# Patient Record
Sex: Female | Born: 1965 | Race: Black or African American | Hispanic: Refuse to answer | Marital: Single | Smoking: Never smoker
Health system: Southern US, Community
[De-identification: ages and names within clinical notes are randomized; demographics above are authoritative.]

## PROBLEM LIST (undated history)

## (undated) DIAGNOSIS — I10 Essential (primary) hypertension: Secondary | ICD-10-CM

## (undated) DIAGNOSIS — Z8709 Personal history of other diseases of the respiratory system: Secondary | ICD-10-CM

## (undated) HISTORY — PX: TONSILLECTOMY: SUR1361

## (undated) HISTORY — PX: TUBAL LIGATION: SHX77

## (undated) HISTORY — DX: Essential (primary) hypertension: I10

---

## 2012-01-07 HISTORY — PX: ABDOMINAL HYSTERECTOMY: SHX81

## 2017-09-30 ENCOUNTER — Ambulatory Visit: Payer: BC Managed Care – PPO | Attending: Family Medicine

## 2017-09-30 ENCOUNTER — Other Ambulatory Visit: Payer: Self-pay

## 2017-09-30 DIAGNOSIS — M542 Cervicalgia: Secondary | ICD-10-CM | POA: Insufficient documentation

## 2017-09-30 DIAGNOSIS — M546 Pain in thoracic spine: Secondary | ICD-10-CM | POA: Insufficient documentation

## 2017-09-30 DIAGNOSIS — R252 Cramp and spasm: Secondary | ICD-10-CM | POA: Diagnosis present

## 2017-09-30 NOTE — Patient Instructions (Signed)
Access Code: [email or web address]  URL: [email or web address]  Date: 09/30/2017  Prepared by: Lorrene Reid   Exercises  Seated Cervical Flexion AROM - 3 reps - 1 sets - 20 hold - 4x daily - 7x weekly  Seated Cervical Sidebending AROM - 3 reps - 1 sets - 20 hold - 4x daily - 7x weekly  Seated Cervical Rotation AROM - 3 reps - 1 sets - 20 hold - 4x daily - 7x weekly  Seated Correct Posture - 10 reps - 3 sets - 1x daily - 7x weekly  Cat-Camel to Child's Pose - 10 reps - 1 sets - 5 hold - 2x daily - 7x weekly  Child's Pose Stretch - 3 reps - 1 sets - 20 hold - 2x daily - 7x weekly  Child's Pose with Sidebending - 10 reps - 20 hold - 2x daily - 7x weekly

## 2017-09-30 NOTE — Therapy (Signed)
[facility] [address] [place], [place], [address] Phone: [phone]   Fax:  [phone]  Physical Therapy Evaluation  Patient Details  Name: Brooke Gonzalez MRN: [id number] Date of Birth: 09-Jun-1965 Referring Provider: Darrow Bussing, MD   Encounter Date: 09/30/2017  PT End of Session - 09/30/17 1144    Visit Number  1    Date for PT Re-Evaluation  11/25/17    Authorization Type  [employer] state health    PT Start Time  1101    PT Stop Time  1147    PT Time Calculation (min)  46 min    Activity Tolerance  Patient tolerated treatment well    Behavior During Therapy  [facility] for tasks assessed/performed       Past Medical History:  Diagnosis Date  . Hypertension     History reviewed. No pertinent surgical history.  There were no vitals filed for this visit.   Subjective Assessment - 09/30/17 1108    Subjective  Pt presents to PT with complaints of Rt sided neck and thoracic pain that began ~1 month ago.  Pt had an upper respiratory infection and has neck pain as result after recovery from this.  Pt reports that Rt neck feels tired and very tight.      Pertinent History  none    Limitations  Sitting    How long can you sit comfortably?  uncomfortable    Diagnostic tests  none    Patient Stated Goals  reduce neck pain and spasms, improve sleep    Currently in Pain?  Yes    Pain Score  3     Pain Location  Neck    Pain Orientation  Right    Pain Descriptors / Indicators  Aching;Shooting;Stabbing    Pain Type  Acute pain    Pain Onset  More than a month ago    Pain Frequency  Intermittent    Aggravating Factors   certain positions, turning head, sleep    Pain Relieving Factors  ice/heat, rubbing the muscles, change of position         [facility] PT Assessment - 09/30/17 0001      Assessment   Medical Diagnosis  strain of neck muscle, Rt sided neck and upper back pain    Referring Provider  Koirala, Dibas, MD     Onset Date/Surgical Date  08/30/17    Hand Dominance  Right    Next MD Visit  nothing scheduled- after PT    Prior Therapy  none      Precautions   Precautions  None      Restrictions   Weight Bearing Restrictions  No      Balance Screen   Has the patient fallen in the past 6 months  No    Has the patient had a decrease in activity level because of a fear of falling?   No    Is the patient reluctant to leave their home because of a fear of falling?   No      Home Public house manager residence      Prior Function   Level of Independence  Independent    Vocation  Full time employment    Careers information officer:  sitting, driving, desk -[employer]    Leisure  walking      Cognition   Overall Cognitive Status  Within Functional Limits for tasks assessed  Observation/Other Assessments   Focus on Therapeutic Outcomes (FOTO)   36% limitation      Posture/Postural Control   Posture/Postural Control  Postural limitations    Postural Limitations  Forward head      ROM / Strength   AROM / PROM / Strength  PROM;AROM;Strength      AROM   Overall AROM   Deficits    Overall AROM Comments  UE A/ROM is full without pain    AROM Assessment Site  Cervical    Cervical Flexion  --   full   Cervical Extension  --   full with painful A/ROM   Cervical - Right Side Bend  30    Cervical - Left Side Bend  25    Cervical - Right Rotation  65    Cervical - Left Rotation  65      PROM   Overall PROM   Within functional limits for tasks performed    Overall PROM Comments  full cervical P/ROM except sidebending limited by 25% with pain      Strength   Overall Strength  Within functional limits for tasks performed    Overall Strength Comments  5/5 UE strength      Palpation   Spinal mobility  reduced spinal segmental mobility C3-T9 with pain throughout with PA mobs    Palpation comment  Pt with significant tension over bil suboccipitals and Rt>Lt upper  traps.  Trigger points along Rt levator and rhomboids      Transfers   Transfers  Independent with all Transfers      Ambulation/Gait   Ambulation/Gait  Yes    Gait Pattern  Within Functional Limits                Objective measurements completed on examination: See above findings.              PT Education - 09/30/17 1143    Education Details   Access Code: [email or web address]     Person(s) Educated  Patient    Methods  Explanation;Demonstration;Handout    Comprehension  Returned demonstration;Verbalized understanding       PT Short Term Goals - 09/30/17 1059      PT SHORT TERM GOAL #1   Title  be independent in initial HEP    Time  4    Period  Weeks    Status  New    Target Date  10/28/17      PT SHORT TERM GOAL #2   Title  report < or = to 4/10 neck pain with turning head    Time  4    Period  Weeks    Target Date  10/28/17      PT SHORT TERM GOAL #3   Title  demonstrate Bil cervical sidebending to > or = to 35 degrees to improve mobility    Time  4    Period  Weeks    Status  New    Target Date  10/28/17        PT Long Term Goals - 09/30/17 1100      PT LONG TERM GOAL #1   Title  be independent in advanced HEP    Time  8    Period  Weeks    Status  New    Target Date  11/25/17      PT LONG TERM GOAL #2   Title  reduce FOTO to < or = to 29% limitation  Time  8    Period  Weeks    Target Date  11/25/17      PT LONG TERM GOAL #3   Title  demonstrate > or = to 75 degrees of cervical rotation bilaterally to improve safety with driving    Time  8    Period  Weeks    Status  New    Target Date  11/25/17      PT LONG TERM GOAL #4   Title  report < or = to 2/10 neck pain with work activity and self-care    Time  8    Period  Weeks    Status  New    Target Date  11/25/17             Plan - 09/30/17 1231    Clinical Impression Statement  Pt presents to PT with 1 month history of Rt sided neck and thoracic pain that began ~1  month ago after coughing a lot with upper respiratory infection.  Pt reports 3-6/10 neck pain with activity at home and at work as a Emergency planning/management officer at [employer].  Pt demonstrates reduced cervical A/ROM with pain with motion, reduced segmental mobility in the cervical and thoracic spine and trigger points in suboccipitals, cervical and thoracic paraspinals and Rt periscapular muscles.  Pt will benefit from skilled PT for manual therapy to address tension and trigger points, flexibility and cervical/thoracic strength to improve endurance for home and work tasks.      History and Personal Factors relevant to plan of care:  no medical history    Clinical Presentation  Stable    Clinical Presentation due to:  1 month history of neck pain after muscle strain-not worsening    Clinical Decision Making  Low    Rehab Potential  Excellent    PT Frequency  2x / week    PT Duration  8 weeks    PT Treatment/Interventions  ADLs/Self Care Home Management;Cryotherapy;Electrical Stimulation;Ultrasound;Moist Heat;Traction;Therapeutic activities;Therapeutic exercise;Patient/family education;Neuromuscular re-education;Manual techniques;Passive range of motion;Taping;Dry needling    PT Next Visit Plan  Dry needling to cervical and thoracic multifidi, suboccipitals and upper traps, review HEP, postural strength    PT Home Exercise Plan  Access Code: [email or web address]     Consulted and Agree with Plan of Care  Patient       Patient will benefit from skilled therapeutic intervention in order to improve the following deficits and impairments:  Impaired flexibility, Decreased range of motion, Decreased strength, Increased muscle spasms, Postural dysfunction, Decreased activity tolerance, Pain  Visit Diagnosis: Cervicalgia - Plan: PT plan of care cert/re-cert  Pain in thoracic spine - Plan: PT plan of care cert/re-cert  Cramp and spasm - Plan: PT plan of care cert/re-cert     Problem List There are no active problems to display  for this patient.    Lorrene Reid, PT 09/30/17 12:36 PM  [facility] [address] [place], [place], [address] Phone: [phone]   Fax:  [phone]  Name: Tylah Mancillas MRN: [id number] Date of Birth: 05-28-1965

## 2017-10-07 ENCOUNTER — Encounter: Payer: Self-pay | Admitting: Physical Therapy

## 2017-10-07 ENCOUNTER — Ambulatory Visit: Payer: BC Managed Care – PPO | Attending: Family Medicine | Admitting: Physical Therapy

## 2017-10-07 DIAGNOSIS — M546 Pain in thoracic spine: Secondary | ICD-10-CM | POA: Diagnosis present

## 2017-10-07 DIAGNOSIS — M542 Cervicalgia: Secondary | ICD-10-CM | POA: Diagnosis present

## 2017-10-07 DIAGNOSIS — R252 Cramp and spasm: Secondary | ICD-10-CM

## 2017-10-07 NOTE — Therapy (Signed)
[facility] [address] [place], [place], [address] Phone: [phone]   Fax:  [phone]  Physical Therapy Treatment  Patient Details  Name: Brooke Gonzalez MRN: [id number] Date of Birth: May 10, 1965 Referring Provider (PT): Darrow Bussing, MD   Encounter Date: 10/07/2017  PT End of Session - 10/07/17 0801    Visit Number  2    Date for PT Re-Evaluation  11/25/17    Authorization Type  [employer] state health    PT Start Time  0801    PT Stop Time  0850    PT Time Calculation (min)  49 min    Activity Tolerance  Patient tolerated treatment well    Behavior During Therapy  [facility] for tasks assessed/performed       Past Medical History:  Diagnosis Date  . Hypertension     History reviewed. No pertinent surgical history.  There were no vitals filed for this visit.  Subjective Assessment - 10/07/17 0838    Subjective  I am really hurting and I'm concerned I am getting worse. My exercises hurt.     Pertinent History  none    Patient Stated Goals  reduce neck pain and spasms, improve sleep    Currently in Pain?  Yes    Pain Score  5     Pain Location  Neck    Pain Orientation  Right;Left;Lateral    Pain Descriptors / Indicators  Sore    Aggravating Factors   tilting my head    Pain Relieving Factors  masage, heat    Multiple Pain Sites  No                       [facility] Adult PT Treatment/Exercise - 10/07/17 0001      Moist Heat Therapy   Number Minutes Moist Heat  15 Minutes    Moist Heat Location  Cervical      Electrical Stimulation   Electrical Stimulation Location  cervical    Electrical Stimulation Action  IFC    Electrical Stimulation Parameters  80-150 Hz    Electrical Stimulation Goals  Pain      Manual Therapy   Soft tissue mobilization  RT cervical > LT, multiple active trigger points noted in scalenes most             PT Education - 10/07/17 0841    Education Details   brief review of initial HEP: pt with correct demo but needed verbal encouragement to actually stretch at the frequency recommended, it is possible to be sore after or even during, but she is not injuring herself by stretching her neck muscles.     Person(s) Educated  Patient    Methods  Explanation;Demonstration;Verbal cues    Comprehension  Returned demonstration;Verbalized understanding       PT Short Term Goals - 09/30/17 1059      PT SHORT TERM GOAL #1   Title  be independent in initial HEP    Time  4    Period  Weeks    Status  New    Target Date  10/28/17      PT SHORT TERM GOAL #2   Title  report < or = to 4/10 neck pain with turning head    Time  4    Period  Weeks    Target Date  10/28/17      PT SHORT TERM GOAL #3   Title  demonstrate Bil  cervical sidebending to > or = to 35 degrees to improve mobility    Time  4    Period  Weeks    Status  New    Target Date  10/28/17        PT Long Term Goals - 09/30/17 1100      PT LONG TERM GOAL #1   Title  be independent in advanced HEP    Time  8    Period  Weeks    Status  New    Target Date  11/25/17      PT LONG TERM GOAL #2   Title  reduce FOTO to < or = to 29% limitation    Time  8    Period  Weeks    Target Date  11/25/17      PT LONG TERM GOAL #3   Title  demonstrate > or = to 75 degrees of cervical rotation bilaterally to improve safety with driving    Time  8    Period  Weeks    Status  New    Target Date  11/25/17      PT LONG TERM GOAL #4   Title  report < or = to 2/10 neck pain with work activity and self-care    Time  8    Period  Weeks    Status  New    Target Date  11/25/17            Plan - 10/07/17 0843    Clinical Impression Statement  Pt presents with more perceived pain ( she just finished her work shift and was exhausted.) She was quite concerned the stretches made her sore/hurt. Discussion about this noted in chart. Pt tight with active trigger points RT cervical > LT cervical  . This seemed to helped and pt reported feeling less pain at the end of session.     Rehab Potential  Excellent    PT Frequency  2x / week    PT Duration  8 weeks    PT Treatment/Interventions  ADLs/Self Care Home Management;Cryotherapy;Electrical Stimulation;Ultrasound;Moist Heat;Traction;Therapeutic activities;Therapeutic exercise;Patient/family education;Neuromuscular re-education;Manual techniques;Passive range of motion;Taping;Dry needling    PT Next Visit Plan  Dry needling to cervical and thoracic multifidi, suboccipitals and upper traps, review HEP, postural strength    PT Home Exercise Plan  Access Code: [email or web address]     Consulted and Agree with Plan of Care  Patient       Patient will benefit from skilled therapeutic intervention in order to improve the following deficits and impairments:  Impaired flexibility, Decreased range of motion, Decreased strength, Increased muscle spasms, Postural dysfunction, Decreased activity tolerance, Pain  Visit Diagnosis: Cervicalgia  Pain in thoracic spine  Cramp and spasm     Problem List There are no active problems to display for this patient.   Deakin Lacek, PTA 10/07/2017, 8:47 AM  [facility] [address], [address] [place], [place], [address] Phone: [phone]   Fax:  [phone]  Name: Brooke Gonzalez MRN: [id number] Date of Birth: 04/05/1965

## 2017-10-09 ENCOUNTER — Ambulatory Visit: Payer: BC Managed Care – PPO | Admitting: Physical Therapy

## 2017-10-09 DIAGNOSIS — M542 Cervicalgia: Secondary | ICD-10-CM | POA: Diagnosis not present

## 2017-10-09 DIAGNOSIS — M546 Pain in thoracic spine: Secondary | ICD-10-CM

## 2017-10-09 DIAGNOSIS — R252 Cramp and spasm: Secondary | ICD-10-CM

## 2017-10-09 NOTE — Therapy (Signed)
[facility] [address], [address] [place], [place], [address] Phone: [phone]   Fax:  [phone]  Physical Therapy Treatment  Patient Details  Name: Brooke Gonzalez MRN: [id number] Date of Birth: June 28, 1965 Referring Provider (PT): Darrow Bussing, MD   Encounter Date: 10/09/2017  PT End of Session - 10/09/17 0845    Visit Number  3    Date for PT Re-Evaluation  11/25/17    Authorization Type  [employer] state health    PT Start Time  0845    PT Stop Time  0946    PT Time Calculation (min)  61 min    Activity Tolerance  Patient tolerated treatment well    Behavior During Therapy  [facility] for tasks assessed/performed       Past Medical History:  Diagnosis Date  . Hypertension     No past surgical history on file.  There were no vitals filed for this visit.  Subjective Assessment - 10/09/17 0848    Subjective  Patient reports her neck hurts.    How long can you sit comfortably?  uncomfortable    Diagnostic tests  none    Patient Stated Goals  reduce neck pain and spasms, improve sleep    Currently in Pain?  Yes    Pain Score  3     Pain Location  Neck    Pain Orientation  Right    Pain Descriptors / Indicators  Sore    Pain Type  Acute pain                       [facility] Adult PT Treatment/Exercise - 10/09/17 0001      Exercises   Exercises  Neck      Neck Exercises: Machines for Strengthening   UBE (Upper Arm Bike)  L 3 x 3 min fwd (too painful going bwd)      Neck Exercises: Supine   Other Supine Exercise  thoracic extension over bolster      Modalities   Modalities  Electrical Stimulation;Moist Heat      Moist Heat Therapy   Number Minutes Moist Heat  15 Minutes    Moist Heat Location  Cervical;Other (comment)   thoracic     Electrical Stimulation   Electrical Stimulation Location  bil UT and Tspine IFC 80-150 Hz x 15 min     Electrical Stimulation Goals  Pain      Manual Therapy   Manual Therapy  Joint mobilization;Soft tissue mobilization    Joint Mobilization  CPA mobs to C/T spine    Soft tissue mobilization  Bil UT, cervical and thoracic paraspinals       Trigger Point Dry Needling - 10/09/17 1231    Consent Given?  Yes    Education Handout Provided  Yes    Muscles Treated Upper Body  Upper trapezius;Levator scapulae;Suboccipitals muscle group   cervical and thoracic mulitifidi   Upper Trapezius Response  Twitch reponse elicited;Palpable increased muscle length   R>L   SubOccipitals Response  Twitch response elicited;Palpable increased muscle length   R>L   Levator Scapulae Response  Twitch response elicited;Palpable increased muscle length   right only          PT Education - 10/09/17 1229    Education Details  HEP; DN education and aftercare    Person(s) Educated  Patient    Methods  Explanation;Demonstration;Handout;Verbal cues    Comprehension  Verbalized understanding;Returned demonstration  PT Short Term Goals - 09/30/17 1059      PT SHORT TERM GOAL #1   Title  be independent in initial HEP    Time  4    Period  Weeks    Status  New    Target Date  10/28/17      PT SHORT TERM GOAL #2   Title  report < or = to 4/10 neck pain with turning head    Time  4    Period  Weeks    Target Date  10/28/17      PT SHORT TERM GOAL #3   Title  demonstrate Bil cervical sidebending to > or = to 35 degrees to improve mobility    Time  4    Period  Weeks    Status  New    Target Date  10/28/17        PT Long Term Goals - 09/30/17 1100      PT LONG TERM GOAL #1   Title  be independent in advanced HEP    Time  8    Period  Weeks    Status  New    Target Date  11/25/17      PT LONG TERM GOAL #2   Title  reduce FOTO to < or = to 29% limitation    Time  8    Period  Weeks    Target Date  11/25/17      PT LONG TERM GOAL #3   Title  demonstrate > or = to 75 degrees of cervical rotation bilaterally to improve safety with driving     Time  8    Period  Weeks    Status  New    Target Date  11/25/17      PT LONG TERM GOAL #4   Title  report < or = to 2/10 neck pain with work activity and self-care    Time  8    Period  Weeks    Status  New    Target Date  11/25/17            Plan - 10/09/17 1234    Clinical Impression Statement  Patient has significant stiffness in mid and upper T spine and extension was added to HEP.  She responded very well to DN in cervical and thoracic spine. Goals are ongoing    Rehab Potential  Excellent    PT Frequency  2x / week    PT Duration  8 weeks    PT Treatment/Interventions  ADLs/Self Care Home Management;Cryotherapy;Electrical Stimulation;Ultrasound;Moist Heat;Traction;Therapeutic activities;Therapeutic exercise;Patient/family education;Neuromuscular re-education;Manual techniques;Passive range of motion;Taping;Dry needling    PT Next Visit Plan  Dry needling to cervical and thoracic multifidi, suboccipitals and upper traps, review HEP, postural strength    PT Home Exercise Plan  Access Code: [email or web address] thoracic extension over bolster and seated    Consulted and Agree with Plan of Care  Patient       Patient will benefit from skilled therapeutic intervention in order to improve the following deficits and impairments:  Impaired flexibility, Decreased range of motion, Decreased strength, Increased muscle spasms, Postural dysfunction, Decreased activity tolerance, Pain  Visit Diagnosis: Cervicalgia  Pain in thoracic spine  Cramp and spasm     Problem List There are no active problems to display for this patient.   Brooke Gonzalez PT 10/09/2017, 12:38 PM  [facility] [address], [address] [place], [place], [address] Phone:  [phone]   Fax:  [phone]  Name: Brooke Gonzalez MRN: [id number] Date of Birth: 1965/07/10

## 2017-10-09 NOTE — Patient Instructions (Addendum)
Trigger Point Dry Needling  . What is Trigger Point Dry Needling (DN)? o DN is a physical therapy technique used to treat muscle pain and dysfunction. Specifically, DN helps deactivate muscle trigger points (muscle knots).  o A thin filiform needle is used to penetrate the skin and stimulate the underlying trigger point. The goal is for a local twitch response (LTR) to occur and for the trigger point to relax. No medication of any kind is injected during the procedure.   . What Does Trigger Point Dry Needling Feel Like?  o The procedure feels different for each individual patient. Some patients report that they do not actually feel the needle enter the skin and overall the process is not painful. Very mild bleeding may occur. However, many patients feel a deep cramping in the muscle in which the needle was inserted. This is the local twitch response.   Marland Kitchen How Will I feel after the treatment? o Soreness is normal, and the onset of soreness may not occur for a few hours. Typically this soreness does not last longer than two days.  o Bruising is uncommon, however; ice can be used to decrease any possible bruising.  o In rare cases feeling tired or nauseous after the treatment is normal. In addition, your symptoms may get worse before they get better, this period will typically not last longer than 24 hours.   . What Can I do After My Treatment? o Increase your hydration by drinking more water for the next 24 hours. o You may place ice or heat on the areas treated that have become sore, however, do not use heat on inflamed or bruised areas. Heat often brings more relief post needling. o You can continue your regular activities, but vigorous activity is not recommended initially after the treatment for 24 hours. o DN is best combined with other physical therapy such as strengthening, stretching, and other therapies.    Precautions:  In some cases, dry needling is done over the lung field. While rare,  there is a risk of pneumothorax (punctured lung). Because of this, if you ever experience shortness of breath on exertion, difficulty taking a deep breath, chest pain or a dry cough following dry needling, you should report to an emergency room and tell them that you have been dry needled over the thorax.   Thoracic Self-Mobilization (Sitting)   With small rolled towel at lower ribs level, gently lean back until stretch is felt. Hold __30_ seconds. Relax. Repeat _3 times.  Copyright  VHI. All rights reserved.  Thoracic Self-Mobilization Stretch (Supine)   With small rolled towel at lower ribs level, gently lie back until stretch is felt. Hold __30-60__ seconds. Relax. Repeat ____ times per set. Do ____ sets per session. Do ___1-2_ sessions per day.  http://orth.exer.us/994   Copyright  VHI. All rights reserved.    Solon Palm, PT 10/09/17 8:50 AM [facility] [address] [place], [place] [address] Phone # [phone] Fax [phone]

## 2017-10-14 ENCOUNTER — Encounter: Payer: Self-pay | Admitting: Physical Therapy

## 2017-10-14 ENCOUNTER — Ambulatory Visit: Payer: BC Managed Care – PPO | Admitting: Physical Therapy

## 2017-10-14 DIAGNOSIS — M546 Pain in thoracic spine: Secondary | ICD-10-CM

## 2017-10-14 DIAGNOSIS — M542 Cervicalgia: Secondary | ICD-10-CM

## 2017-10-14 DIAGNOSIS — R252 Cramp and spasm: Secondary | ICD-10-CM

## 2017-10-14 NOTE — Therapy (Signed)
[facility] [address] [place], [place], [address] Phone: [phone]   Fax:  [phone]  Physical Therapy Treatment  Patient Details  Name: Brooke Gonzalez MRN: [id number] Date of Birth: [birth year] Referring Provider (PT): Darrow Bussing, MD   Encounter Date: 10/14/2017  PT End of Session - 10/14/17 1343    Visit Number  4    Date for PT Re-Evaluation  11/25/17    Authorization Type  [employer] state health    PT Start Time  0800    PT Stop Time  0857    PT Time Calculation (min)  57 min    Activity Tolerance  Patient tolerated treatment well    Behavior During Therapy  [facility] for tasks assessed/performed       Past Medical History:  Diagnosis Date  . Hypertension     History reviewed. No pertinent surgical history.  There were no vitals filed for this visit.  Subjective Assessment - 10/14/17 0759    Subjective  Pt reports her neck hurts.  Notes that she hasn't had as many headaches.  Has trouble getting comfortable at night due to neck pain.  Tolerated needling last visit but wasn't sure if it provided much relief.    Limitations  Sitting    Patient Stated Goals  reduce neck pain and spasms, improve sleep    Currently in Pain?  Yes    Pain Score  5     Pain Location  Neck    Pain Orientation  Right    Pain Descriptors / Indicators  Sharp;Cramping;Stabbing    Pain Type  Acute pain    Pain Onset  More than a month ago    Aggravating Factors   tilting head into SB and looking down         [facility] PT Assessment - 10/14/17 0001      AROM   Cervical Flexion  15   45 end of session   Cervical Extension  35   40 end of session without pain   Cervical - Right Side Bend  12   15 end of session   Cervical - Left Side Bend  18   30 end of session   Cervical - Right Rotation  60   65 end of session   Cervical - Left Rotation  80                   [facility] Adult PT Treatment/Exercise - 10/14/17  0001      Modalities   Modalities  Electrical Stimulation;Moist Heat      Moist Heat Therapy   Number Minutes Moist Heat  15 Minutes    Moist Heat Location  Cervical      Electrical Stimulation   Electrical Stimulation Location  cervical paraspinals, upper traps, IFC 80-150 Hz x 15 min     Electrical Stimulation Goals  Pain      Manual Therapy   Manual Therapy  Joint mobilization;Soft tissue mobilization    Joint Mobilization  Rt OA and C1/2 mobilization for extension/Rt rot with contract/relax Gr III/IV    Soft tissue mobilization  bil upper traps, Rt suboccipitals, scalenes, cervical paraspinals        Trigger Point Dry Needling - 10/14/17 1342    Muscles Treated Upper Body  Suboccipitals muscle group;Upper trapezius   suboccip on Rt, deep multifidi Lt C5/6, C6/7, upper trap Rt   Upper Trapezius Response  Twitch reponse elicited;Palpable increased muscle length  SubOccipitals Response  Twitch response elicited;Palpable increased muscle length           PT Education - 10/14/17 1016    Education Details  see instructions    Person(s) Educated  Patient    Methods  Explanation;Demonstration;Verbal cues    Comprehension  Verbalized understanding;Returned demonstration       PT Short Term Goals - 09/30/17 1059      PT SHORT TERM GOAL #1   Title  be independent in initial HEP    Time  4    Period  Weeks    Status  New    Target Date  10/28/17      PT SHORT TERM GOAL #2   Title  report < or = to 4/10 neck pain with turning head    Time  4    Period  Weeks    Target Date  10/28/17      PT SHORT TERM GOAL #3   Title  demonstrate Bil cervical sidebending to > or = to 35 degrees to improve mobility    Time  4    Period  Weeks    Status  New    Target Date  10/28/17        PT Long Term Goals - 09/30/17 1100      PT LONG TERM GOAL #1   Title  be independent in advanced HEP    Time  8    Period  Weeks    Status  New    Target Date  11/25/17      PT LONG  TERM GOAL #2   Title  reduce FOTO to < or = to 29% limitation    Time  8    Period  Weeks    Target Date  11/25/17      PT LONG TERM GOAL #3   Title  demonstrate > or = to 75 degrees of cervical rotation bilaterally to improve safety with driving    Time  8    Period  Weeks    Status  New    Target Date  11/25/17      PT LONG TERM GOAL #4   Title  report < or = to 2/10 neck pain with work activity and self-care    Time  8    Period  Weeks    Status  New    Target Date  11/25/17            Plan - 10/14/17 1344    Clinical Impression Statement  Pt with significant improvement today in ROM into flexion and extension > rotation > sidebending with less pain at end range following manual therapy and dry needling today.  Most notably was her neck flexion with improved from 15 degrees with pain to 45 degrees without pain end of session.  She will continue to benefit from skilled PT to address her pain and deficits.    Rehab Potential  Excellent    PT Frequency  2x / week    PT Duration  8 weeks    PT Treatment/Interventions  ADLs/Self Care Home Management;Cryotherapy;Electrical Stimulation;Ultrasound;Moist Heat;Traction;Therapeutic activities;Therapeutic exercise;Patient/family education;Neuromuscular re-education;Manual techniques;Passive range of motion;Taping;Dry needling    PT Next Visit Plan  continue upper cervical joint mobs, dry needling to cervical and thoracic multifidi, suboccipitals and upper traps, review HEP, postural strength    PT Home Exercise Plan  Access Code: [email or web address]    Consulted and Agree with Plan of Care  Patient  Patient will benefit from skilled therapeutic intervention in order to improve the following deficits and impairments:  Impaired flexibility, Decreased range of motion, Decreased strength, Increased muscle spasms, Postural dysfunction, Decreased activity tolerance, Pain  Visit Diagnosis: Cervicalgia  Pain in thoracic spine  Cramp and  spasm     Problem List There are no active problems to display for this patient.  Loistine Simas Folashade Gamboa, PT 10/14/17 1:52 PM    [facility] [address] [place], [place], [address] Phone: [phone]   Fax:  [phone]  Name: Brooke Gonzalez MRN: [id number] Date of Birth: [birth year]

## 2017-10-14 NOTE — Patient Instructions (Signed)
Pt to use tennis balls in a sock to perform self release in supine of upper cervical region, self-mobilization to upper tspine with cervical rotation to improve range and pain with rotation

## 2017-10-16 ENCOUNTER — Ambulatory Visit: Payer: BC Managed Care – PPO | Admitting: Physical Therapy

## 2017-10-21 ENCOUNTER — Ambulatory Visit: Payer: BC Managed Care – PPO | Admitting: Physical Therapy

## 2017-10-21 ENCOUNTER — Encounter: Payer: Self-pay | Admitting: Physical Therapy

## 2017-10-21 DIAGNOSIS — R252 Cramp and spasm: Secondary | ICD-10-CM

## 2017-10-21 DIAGNOSIS — M542 Cervicalgia: Secondary | ICD-10-CM

## 2017-10-21 DIAGNOSIS — M546 Pain in thoracic spine: Secondary | ICD-10-CM

## 2017-10-21 NOTE — Therapy (Signed)
[facility] [address], [address] [place], [place], [address] Phone: [phone]   Fax:  [phone]  Physical Therapy Treatment  Patient Details  Name: Brooke Gonzalez MRN: [id number] Date of Birth: 1965/10/01 Referring Provider (PT): Darrow Bussing, MD   Encounter Date: 10/21/2017  PT End of Session - 10/21/17 1335    Visit Number  5    Date for PT Re-Evaluation  11/25/17    Authorization Type  [employer] state health    PT Start Time  1232    PT Stop Time  1317    PT Time Calculation (min)  45 min    Activity Tolerance  Patient tolerated treatment well    Behavior During Therapy  [facility] for tasks assessed/performed       Past Medical History:  Diagnosis Date  . Hypertension     History reviewed. No pertinent surgical history.  There were no vitals filed for this visit.  Subjective Assessment - 10/21/17 1235    Subjective  Pt states she had 30-40% improvement in pain from last visit but felt the relief wore off yesterday.  Rt side of back of neck is very tight.  She is concerned about hearing popping in her neck with movement somtimes.  She is sleeping better with airport pillow.  Has not tried self release with tennis balls yet.    Currently in Pain?  Yes    Pain Score  4     Pain Location  Neck    Pain Orientation  Right    Pain Descriptors / Indicators  Tightness;Tiring;Stabbing    Pain Type  Acute pain    Pain Onset  More than a month ago    Aggravating Factors   movement of head    Pain Relieving Factors  massage                       [facility] Adult PT Treatment/Exercise - 10/21/17 0001      Exercises   Exercises  Neck      Neck Exercises: Supine   Neck Retraction  --   on dense foam roller   Cervical Rotation  Right;Left;10 reps   on dense foam roller   Other Supine Exercise  self mobilization with dense foam roller under neck and thoracic spine, rolling and rotation      Manual Therapy   Manual Therapy  Joint mobilization;Soft tissue mobilization    Joint Mobilization  Rt OA and C1/2 Gr II/III for extension and Rt Rot, PAs T1-T8 Gr III/IV, rib springing bil Gr II/III    Soft tissue mobilization  Rt upper trap, SO after dry needling       Trigger Point Dry Needling - 10/21/17 1327    Consent Given?  Yes    Muscles Treated Upper Body  Suboccipitals muscle group;Upper trapezius;Supraspinatus   all on Rt, deep cervical multifidus C4-C7 on Rt   Upper Trapezius Response  Twitch reponse elicited;Palpable increased muscle length    SubOccipitals Response  Twitch response elicited;Palpable increased muscle length    Supraspinatus Response  Twitch response elicited;Palpable increased muscle length           PT Education - 10/21/17 1334    Education Details  see instructions - self-mob c-spine and t-spine with foam roller, future progression of PT with cervical stabilization program    Person(s) Educated  Patient    Methods  Explanation;Demonstration    Comprehension  Verbalized understanding;Returned demonstration  PT Short Term Goals - 09/30/17 1059      PT SHORT TERM GOAL #1   Title  be independent in initial HEP    Time  4    Period  Weeks    Status  New    Target Date  10/28/17      PT SHORT TERM GOAL #2   Title  report < or = to 4/10 neck pain with turning head    Time  4    Period  Weeks    Target Date  10/28/17      PT SHORT TERM GOAL #3   Title  demonstrate Bil cervical sidebending to > or = to 35 degrees to improve mobility    Time  4    Period  Weeks    Status  New    Target Date  10/28/17        PT Long Term Goals - 09/30/17 1100      PT LONG TERM GOAL #1   Title  be independent in advanced HEP    Time  8    Period  Weeks    Status  New    Target Date  11/25/17      PT LONG TERM GOAL #2   Title  reduce FOTO to < or = to 29% limitation    Time  8    Period  Weeks    Target Date  11/25/17      PT LONG TERM GOAL #3   Title   demonstrate > or = to 75 degrees of cervical rotation bilaterally to improve safety with driving    Time  8    Period  Weeks    Status  New    Target Date  11/25/17      PT LONG TERM GOAL #4   Title  report < or = to 2/10 neck pain with work activity and self-care    Time  8    Period  Weeks    Status  New    Target Date  11/25/17            Plan - 10/21/17 1336    Clinical Impression Statement  Pt reported about a week of improvement in pain and headaches following last session.  Pt felt her pain had settled back in yesterday.  She displays Rt sided muscle guarding in upper traps, deep multifidi and SO muscles and limited joint mobility in Rt upper cervical spine and upper thoracic spine.  PT noted improved muscle tone and range of motion with dry needling, mobilization and soft tissue mobilization today.  PT had Pt trial self-release and self-mobilization on foam roller today which Pt reported great relief with.  She will benefit from attending consistent appointments at least 1x/week so gains can be made between appointments and progress can be made into stabilization and strengthening along with manual therapy.  She will continue to benefit from skilled intervention to address pain and deficits in ROM, soft tissue extensibility, joint mobility and strength.    Rehab Potential  Excellent    PT Frequency  2x / week    PT Duration  8 weeks    PT Treatment/Interventions  ADLs/Self Care Home Management;Cryotherapy;Electrical Stimulation;Ultrasound;Moist Heat;Traction;Therapeutic activities;Therapeutic exercise;Patient/family education;Neuromuscular re-education;Manual techniques;Passive range of motion;Taping;Dry needling    PT Next Visit Plan  Pt has appointments 2 days in a row, today's focus was mostly manual therapy, next visit introduce postural stabilization/cervical stabilization program.    PT Home  Exercise Plan  Access Code: [email or web address]    Consulted and Agree with Plan of Care   Patient       Patient will benefit from skilled therapeutic intervention in order to improve the following deficits and impairments:  Impaired flexibility, Decreased range of motion, Decreased strength, Increased muscle spasms, Postural dysfunction, Decreased activity tolerance, Pain  Visit Diagnosis: Cervicalgia  Pain in thoracic spine  Cramp and spasm     Problem List There are no active problems to display for this patient.  Loistine Simas Beuhring, PT 10/21/17 1:47 PM    [facility] [address], [address] [place], [place], [address] Phone: [phone]   Fax:  [phone]  Name: Brooke Gonzalez MRN: [id number] Date of Birth: 07-Jul-1965

## 2017-10-21 NOTE — Patient Instructions (Signed)
Self release and mobilization with foam roller, importance of building cervical stabilization program, explanation about "joint noise" with motion when joints not tracking properly due to poor support of local muscles

## 2017-10-22 ENCOUNTER — Ambulatory Visit: Payer: BC Managed Care – PPO | Admitting: Physical Therapy

## 2017-10-22 DIAGNOSIS — M546 Pain in thoracic spine: Secondary | ICD-10-CM

## 2017-10-22 DIAGNOSIS — M542 Cervicalgia: Secondary | ICD-10-CM

## 2017-10-22 DIAGNOSIS — R252 Cramp and spasm: Secondary | ICD-10-CM

## 2017-10-22 NOTE — Therapy (Signed)
[facility] [address] [place], [place], [address] Phone: [phone]   Fax:  [phone]  Physical Therapy Treatment  Patient Details  Name: Brooke Gonzalez MRN: [id number] Date of Birth: 12-Jun-1965 Referring Provider (PT): Darrow Bussing, MD   Encounter Date: 10/22/2017  PT End of Session - 10/22/17 0848    Visit Number  6    Date for PT Re-Evaluation  11/25/17    Authorization Type  [employer] state health    PT Start Time  0848    PT Stop Time  0933    PT Time Calculation (min)  45 min    Activity Tolerance  Patient tolerated treatment well    Behavior During Therapy  [facility] for tasks assessed/performed       Past Medical History:  Diagnosis Date  . Hypertension     No past surgical history on file.  There were no vitals filed for this visit.  Subjective Assessment - 10/22/17 0925    Subjective  Patient states her pain is better today. She still feels it on R side.    Patient Stated Goals  reduce neck pain and spasms, improve sleep    Currently in Pain?  Yes    Pain Score  3     Pain Location  Neck    Pain Orientation  Right    Pain Descriptors / Indicators  --   tightness                      [facility] Adult PT Treatment/Exercise - 10/22/17 0001      Exercises   Exercises  Neck      Neck Exercises: Seated   Cervical Rotation  5 reps;Right   after distraction (decreased pain)     Neck Exercises: Supine   Neck Retraction  10 reps;5 secs   on foam; then 5 into small blue ball   Cervical Rotation  Right;5 reps   with retraction into blue ball   Shoulder Flexion  Both;20 reps   with green band with chin tuck into ball on foam   Shoulder ABduction  20 reps;10 reps;Both;Other (comment)   with green band with chin tuck into ball on foam     Modalities   Modalities  Traction      Traction   Type of Traction  Cervical    Min (lbs)  5    Max (lbs)  15    Hold Time  60    Rest Time   20    Time  15      Manual Therapy   Manual Therapy  Manual Traction    Manual Traction  seated distraction with active rotation x 5 to right               PT Short Term Goals - 10/22/17 0924      PT SHORT TERM GOAL #1   Title  be independent in initial HEP    Time  4    Status  On-going        PT Long Term Goals - 09/30/17 1100      PT LONG TERM GOAL #1   Title  be independent in advanced HEP    Time  8    Period  Weeks    Status  New    Target Date  11/25/17      PT LONG TERM GOAL #2   Title  reduce FOTO to < or =  to 29% limitation    Time  8    Period  Weeks    Target Date  11/25/17      PT LONG TERM GOAL #3   Title  demonstrate > or = to 75 degrees of cervical rotation bilaterally to improve safety with driving    Time  8    Period  Weeks    Status  New    Target Date  11/25/17      PT LONG TERM GOAL #4   Title  report < or = to 2/10 neck pain with work activity and self-care    Time  8    Period  Weeks    Status  New    Target Date  11/25/17            Plan - 10/22/17 0926    Clinical Impression Statement  Pt did well with stabilization TE but continues to report pain in R neck with retraction and rotation. She reported significant relief with distraction and rotation so round of mechanical traction was tried today which patient reported some relief with.    PT Treatment/Interventions  ADLs/Self Care Home Management;Cryotherapy;Electrical Stimulation;Ultrasound;Moist Heat;Traction;Therapeutic activities;Therapeutic exercise;Patient/family education;Neuromuscular re-education;Manual techniques;Passive range of motion;Taping;Dry needling    PT Next Visit Plan  assess mechanical traction; continue with manual prn and stabilization       Patient will benefit from skilled therapeutic intervention in order to improve the following deficits and impairments:  Impaired flexibility, Decreased range of motion, Decreased strength, Increased muscle  spasms, Postural dysfunction, Decreased activity tolerance, Pain  Visit Diagnosis: Cervicalgia  Cramp and spasm  Pain in thoracic spine     Problem List There are no active problems to display for this patient.   Montrey Buist PT 10/22/2017, 10:57 AM  [facility] [address], [address] [place], [place], [address] Phone: [phone]   Fax:  [phone]  Name: Brooke Gonzalez MRN: [id number] Date of Birth: 22-Jun-1965

## 2017-10-30 ENCOUNTER — Encounter

## 2017-11-02 ENCOUNTER — Ambulatory Visit: Payer: BC Managed Care – PPO | Admitting: Physical Therapy

## 2017-11-02 DIAGNOSIS — M542 Cervicalgia: Secondary | ICD-10-CM | POA: Diagnosis not present

## 2017-11-02 DIAGNOSIS — R252 Cramp and spasm: Secondary | ICD-10-CM

## 2017-11-02 DIAGNOSIS — M546 Pain in thoracic spine: Secondary | ICD-10-CM

## 2017-11-02 NOTE — Therapy (Signed)
[facility] [address], [address] [place], [place], [address] Phone: [phone]   Fax:  [phone]  Physical Therapy Treatment  Patient Details  Name: Brooke Gonzalez MRN: [id number] Date of Birth: 31-Mar-1965 Referring Provider (PT): Darrow Bussing, MD   Encounter Date: 11/02/2017  PT End of Session - 11/02/17 0945    Visit Number  7    Date for PT Re-Evaluation  11/25/17    Authorization Type  [employer] state health    PT Start Time  0945   pt 15 min late   PT Stop Time  1020    PT Time Calculation (min)  35 min    Activity Tolerance  Patient tolerated treatment well    Behavior During Therapy  [facility] for tasks assessed/performed       Past Medical History:  Diagnosis Date  . Hypertension     No past surgical history on file.  There were no vitals filed for this visit.  Subjective Assessment - 11/02/17 0946    Subjective  Patient 15 min late for appt. She reports little to no improvement and would like to hold PT after today and return to MD.     Patient Stated Goals  reduce neck pain and spasms, improve sleep    Currently in Pain?  Yes    Pain Score  4     Pain Location  Neck    Pain Orientation  Right    Pain Descriptors / Indicators  Stabbing    Pain Type  Acute pain    Pain Onset  More than a month ago    Pain Frequency  Intermittent    Aggravating Factors   lateral flexion and rotation    Pain Relieving Factors  massage    Effect of Pain on Daily Activities  limited                       [facility] Adult PT Treatment/Exercise - 11/02/17 0001      Traction   Type of Traction  Cervical    Min (lbs)  5    Max (lbs)  20    Hold Time  60    Rest Time  20    Time  15      Manual Therapy   Manual Therapy  Joint mobilization;Manual Traction    Joint Mobilization  PA mobs unilateral to cspine and CPA to T1-6/7; also mob with movement to R C3 into flex and SB    Soft tissue mobilization  to  cspine    Manual Traction  seated distraction with active rotation x 5 to right               PT Short Term Goals - 11/02/17 0948      PT SHORT TERM GOAL #1   Title  be independent in initial HEP    Period  Weeks    Status  Achieved      PT SHORT TERM GOAL #2   Title  report < or = to 4/10 neck pain with turning head    Baseline  up to 6-7/10 with rotation    Time  4    Status  On-going      PT SHORT TERM GOAL #3   Title  demonstrate Bil cervical sidebending to > or = to 35 degrees to improve mobility    Baseline  12 deg bil    Time  4  Status  On-going        PT Long Term Goals - 11/02/17 0951      PT LONG TERM GOAL #1   Title  be independent in advanced HEP    Time  8    Period  Weeks    Status  On-going      PT LONG TERM GOAL #3   Title  demonstrate > or = to 75 degrees of cervical rotation bilaterally to improve safety with driving    Time  8    Status  On-going      PT LONG TERM GOAL #4   Title  report < or = to 2/10 neck pain with work activity and self-care    Time  8    Period  Weeks    Status  On-going            Plan - 11/02/17 1346    Clinical Impression Statement  Patient reports little to no improvement after 7 visits and would like to return to her doctor. She reports compliance with HEP. She has pain with PA mobs to R C3/4 and gets some relief with distraction. Two bouts of mechanical traction were done, but no significant relief was reported.    PT Frequency  2x / week    PT Treatment/Interventions  ADLs/Self Care Home Management;Cryotherapy;Electrical Stimulation;Ultrasound;Moist Heat;Traction;Therapeutic activities;Therapeutic exercise;Patient/family education;Neuromuscular re-education;Manual techniques;Passive range of motion;Taping;Dry needling    PT Next Visit Plan  hold PT until pt returns to MD    PT Home Exercise Plan  Access Code: [email or web address]    Consulted and Agree with Plan of Care  Patient       Patient will benefit  from skilled therapeutic intervention in order to improve the following deficits and impairments:  Impaired flexibility, Decreased range of motion, Decreased strength, Increased muscle spasms, Postural dysfunction, Decreased activity tolerance, Pain  Visit Diagnosis: Cervicalgia  Cramp and spasm  Pain in thoracic spine     Problem List There are no active problems to display for this patient.   Brooke Gonzalez PT 11/02/2017, 1:51 PM  [facility] [address] [place], [place], [address] Phone: [phone]   Fax:  [phone]  Name: Brooke Gonzalez MRN: [id number] Date of Birth: 16-Jun-1965

## 2017-11-04 ENCOUNTER — Encounter: Payer: BC Managed Care – PPO | Admitting: Physical Therapy

## 2018-01-22 ENCOUNTER — Ambulatory Visit (INDEPENDENT_AMBULATORY_CARE_PROVIDER_SITE_OTHER): Payer: BC Managed Care – PPO

## 2018-01-22 ENCOUNTER — Ambulatory Visit (INDEPENDENT_AMBULATORY_CARE_PROVIDER_SITE_OTHER): Payer: Self-pay | Admitting: Orthopedic Surgery

## 2018-01-22 ENCOUNTER — Encounter (INDEPENDENT_AMBULATORY_CARE_PROVIDER_SITE_OTHER): Payer: Self-pay | Admitting: Family Medicine

## 2018-01-22 ENCOUNTER — Ambulatory Visit (INDEPENDENT_AMBULATORY_CARE_PROVIDER_SITE_OTHER): Payer: BC Managed Care – PPO | Admitting: Family Medicine

## 2018-01-22 DIAGNOSIS — M25561 Pain in right knee: Secondary | ICD-10-CM | POA: Diagnosis not present

## 2018-01-22 DIAGNOSIS — M542 Cervicalgia: Secondary | ICD-10-CM

## 2018-01-22 DIAGNOSIS — M25562 Pain in left knee: Secondary | ICD-10-CM

## 2018-01-22 MED ORDER — METHYLPREDNISOLONE ACETATE 40 MG/ML IJ SUSP
40.0000 mg | INTRAMUSCULAR | Status: AC | PRN
  Administered 2018-01-22: 40 mg via INTRA_ARTICULAR

## 2018-01-22 MED ORDER — LIDOCAINE HCL 1 % IJ SOLN
3.0000 mL | INTRAMUSCULAR | Status: AC | PRN
  Administered 2018-01-22: 3 mL

## 2018-01-22 MED ORDER — ETODOLAC 400 MG PO TABS: 400.0000 mg | ORAL_TABLET | Freq: Two times a day (BID) | ORAL | 3 refills | Status: DC | PRN
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
## 2018-02-01 ENCOUNTER — Ambulatory Visit
Admission: RE | Admit: 2018-02-01 | Discharge: 2018-02-01 | Disposition: A | Discharge status: Home / Self Care | Payer: BC Managed Care – PPO | Source: Ambulatory Visit | Attending: Family Medicine | Admitting: Family Medicine

## 2018-02-01 DIAGNOSIS — M542 Cervicalgia: Secondary | ICD-10-CM

## 2018-02-02 ENCOUNTER — Telehealth (INDEPENDENT_AMBULATORY_CARE_PROVIDER_SITE_OTHER): Payer: Self-pay | Admitting: Family Medicine

## 2018-02-02 DIAGNOSIS — M542 Cervicalgia: Secondary | ICD-10-CM

## 2018-02-02 NOTE — Telephone Encounter (Signed)
I called and advised the patient of the MRI results and the plan - she would like to proceed with the steroid injection by Dr. Alvester Morin. She will await a call from one of his schedulers.

## 2018-02-02 NOTE — Telephone Encounter (Signed)
MRI shows a couple bulging discs in the neck, but no nerve impingement.  No indication for surgery.  Would suggest referral to Dr. Alvester Morin for possible facet joint injection.

## 2018-02-25 ENCOUNTER — Encounter (INDEPENDENT_AMBULATORY_CARE_PROVIDER_SITE_OTHER): Payer: Self-pay | Admitting: Physical Medicine and Rehabilitation

## 2018-02-25 ENCOUNTER — Ambulatory Visit (INDEPENDENT_AMBULATORY_CARE_PROVIDER_SITE_OTHER): Payer: BC Managed Care – PPO | Admitting: Physical Medicine and Rehabilitation

## 2018-02-25 ENCOUNTER — Telehealth (INDEPENDENT_AMBULATORY_CARE_PROVIDER_SITE_OTHER): Payer: Self-pay | Admitting: *Deleted

## 2018-02-25 VITALS — BP 136/97 | HR 78 | Ht 67.0 in | Wt 170.0 lb

## 2018-02-25 DIAGNOSIS — M7918 Myalgia, other site: Secondary | ICD-10-CM

## 2018-02-25 DIAGNOSIS — M542 Cervicalgia: Secondary | ICD-10-CM | POA: Diagnosis not present

## 2018-02-25 NOTE — Telephone Encounter (Signed)
Spoke with Lawrence Santiago from [facility] and he states for outpatient basis no pa is needed for [id number] and [phone].  Reference# [id number]

## 2018-02-25 NOTE — Progress Notes (Signed)
 .  Numeric Pain Rating Scale and Functional Assessment Average Pain 6 Pain Right Now 4 My pain is constant, sharp, dull and aching Pain is worse with: some activites Pain improves with: nothing   In the last MONTH (on 0-10 scale) has pain interfered with the following?  1. General activity like being  able to carry out your everyday physical activities such as walking, climbing stairs, carrying groceries, or moving a chair?  Rating(5)  2. Relation with others like being able to carry out your usual social activities and roles such as  activities at home, at work and in your community. Rating(5)  3. Enjoyment of life such that you have  been bothered by emotional problems such as feeling anxious, depressed or irritable?  Rating(2)

## 2018-03-01 ENCOUNTER — Encounter (INDEPENDENT_AMBULATORY_CARE_PROVIDER_SITE_OTHER): Payer: Self-pay | Admitting: Physical Medicine and Rehabilitation

## 2018-03-01 ENCOUNTER — Ambulatory Visit (INDEPENDENT_AMBULATORY_CARE_PROVIDER_SITE_OTHER): Payer: BC Managed Care – PPO

## 2018-03-01 ENCOUNTER — Ambulatory Visit (INDEPENDENT_AMBULATORY_CARE_PROVIDER_SITE_OTHER): Payer: BC Managed Care – PPO | Admitting: Physical Medicine and Rehabilitation

## 2018-03-01 VITALS — BP 133/94 | HR 64 | Temp 98.2°F

## 2018-03-01 DIAGNOSIS — M542 Cervicalgia: Secondary | ICD-10-CM

## 2018-03-01 DIAGNOSIS — M47812 Spondylosis without myelopathy or radiculopathy, cervical region: Secondary | ICD-10-CM

## 2018-03-01 MED ORDER — METHYLPREDNISOLONE ACETATE 80 MG/ML IJ SUSP
80.0000 mg | Freq: Once | INTRAMUSCULAR | Status: AC
  Administered 2018-03-01: 80 mg

## 2018-03-01 NOTE — Progress Notes (Signed)
 .  Numeric Pain Rating Scale and Functional Assessment Average Pain 6   In the last MONTH (on 0-10 scale) has pain interfered with the following?  1. General activity like being  able to carry out your everyday physical activities such as walking, climbing stairs, carrying groceries, or moving a chair?  Rating(4)   +Driver, -BT, -Dye Allergies.  

## 2018-03-01 NOTE — Progress Notes (Signed)
[place] - 53 y.o. female MRN [id number]  Date of birth: [birth year]  Office Visit Note: Visit Date: 03/01/2018 PCP: Darrow Bussing, MD Referred by: Darrow Bussing, MD  Subjective: Chief Complaint  Patient presents with  . Neck - Pain  . Middle Back - Pain   HPI: Brooke Gonzalez is a 53 y.o. female who comes in today For planned right-sided C2-3 and C3-4 facet joint block.  Please see our prior evaluation and management note for further details and justification.  ROS Otherwise per HPI.  Assessment & Plan: Visit Diagnoses:  1. Cervical spondylosis without myelopathy   2. Cervicalgia     Plan: No additional findings.   Meds & Orders:  Meds ordered this encounter  Medications  . methylPREDNISolone acetate (DEPO-MEDROL) injection 80 mg    Orders Placed This Encounter  Procedures  . Facet Injection  . XR C-ARM NO REPORT    Follow-up: Return if symptoms worsen or fail to improve.   Procedures: No procedures performed  Cervical Facet Joint Intra-Articular Injection with Fluoroscopic Guidance  Patient: Brooke Gonzalez      Date of Birth: September 01, 1965 MRN: [id number] PCP: Darrow Bussing, MD      Visit Date: 03/01/2018   Universal Protocol:    Date/Time: 03/04/2010:54 PM  Consent Given By: the patient  Position: lateral  Additional Comments: Vital signs were monitored before and after the procedure. Patient was prepped and draped in the usual sterile fashion. The correct patient, procedure, and site was verified.   Injection Procedure Details:  Procedure Site One Meds Administered:  Meds ordered this encounter  Medications  . methylPREDNISolone acetate (DEPO-MEDROL) injection 80 mg     Laterality: Right  Location/Site:  C2-3 C3-4  Needle size: 25 G  Needle type: spinal needle  Needle Placement: Articular  Findings:    -Comments: Excellent flow of contrast producing a partial arthrogram.  Procedure Details: The fluoroscope beam was  manipulated to achieve the best "true" lateral view possible by squaring off the endplates with cranial and caudal tilt and using varying obliquity to achieve the a view with the longest length of spinous process.   The region overlying the facet joints mentioned above were then localized under fluoroscopic visualization. For each target described below the skin was anesthetized with 1 ml of 1% Lidocaine without epinephrine. The needle was inserted down to the level of the lateral mass of the superior articular process of the  facet joint to be injected. Then, the needle was "walked off" inferiorly into the lateral aspect of the facet joint. Bi-planar images were used for confirming placement and spot radiographs were documented. Radiographs were obtained of the arthrogram. A 0.5 ml. volume of the steroid/anesthetic solution was injected into the joint. This procedure was repeated for each facet joint injected.   Additional Comments:  The patient tolerated the procedure well Dressing: Band-Aid    Post-procedure details: Patient was observed during the procedure. Post-procedure instructions were reviewed.  Patient left the clinic in stable condition.    Clinical History: MRI CERVICAL SPINE WITHOUT CONTRAST  TECHNIQUE: Multiplanar, multisequence MR imaging of the cervical spine was performed. No intravenous contrast was administered.  COMPARISON:  None.  FINDINGS: Alignment: Physiologic.  Vertebrae: No fracture, evidence of discitis, or bone lesion.  Cord: Normal signal and morphology.  Posterior Fossa, vertebral arteries, paraspinal tissues: Posterior fossa demonstrates no focal abnormality. Vertebral artery flow voids are maintained. Paraspinal soft tissues are unremarkable.  Disc levels:  Discs: Degenerative disc disease with disc height  loss at C4-5 and C5-6.  C2-3: No significant disc bulge. Mild bilateral facet arthropathy. Mild right foraminal stenosis. No  left foraminal stenosis. No central canal stenosis.  C3-4: No significant disc bulge. No neural foraminal stenosis. No central canal stenosis.  C4-5: No significant disc bulge. No neural foraminal stenosis. No central canal stenosis.  C5-6: Mild broad-based disc bulge. Mild left foraminal narrowing. No right foraminal narrowing. No central canal stenosis.  C6-7: Mild broad-based disc bulge. No neural foraminal stenosis. No central canal stenosis.  C7-T1: No significant disc bulge. No neural foraminal stenosis. No central canal stenosis.  IMPRESSION: 1. Mild cervical spine spondylosis as described above. 2.  No acute osseous injury of the cervical spine.   Electronically Signed   By: Elige Ko   On: 02/01/2018 16:29   She has no history on file for tobacco. No results for input(s): HGBA1C, LABURIC in the last 8760 hours.  Objective:  VS:  HT:    WT:   BMI:     BP:(!) 133/94  HR:64bpm  TEMP:98.2 F (36.8 C)(Oral)  RESP:  Physical Exam  Ortho Exam Imaging: No results found.  Past Medical/Family/Surgical/Social History: Medications & Allergies reviewed per EMR, new medications updated. There are no active problems to display for this patient.  Past Medical History:  Diagnosis Date  . Hypertension    History reviewed. No pertinent family history. History reviewed. No pertinent surgical history. Social History   Occupational History  . Not on file  Tobacco Use  . Smoking status: Not on file  Substance and Sexual Activity  . Alcohol use: Not on file  . Drug use: Not on file  . Sexual activity: Not on file

## 2018-03-04 NOTE — Procedures (Signed)
Cervical Facet Joint Intra-Articular Injection with Fluoroscopic Guidance  Patient: Brooke Gonzalez      Date of Birth: [birth year] MRN: [id number] PCP: Darrow Bussing, MD      Visit Date: 03/01/2018   Universal Protocol:    Date/Time: 03/04/2010:54 PM  Consent Given By: the patient  Position: lateral  Additional Comments: Vital signs were monitored before and after the procedure. Patient was prepped and draped in the usual sterile fashion. The correct patient, procedure, and site was verified.   Injection Procedure Details:  Procedure Site One Meds Administered:  Meds ordered this encounter  Medications  . methylPREDNISolone acetate (DEPO-MEDROL) injection 80 mg     Laterality: Right  Location/Site:  C2-3 C3-4  Needle size: 25 G  Needle type: spinal needle  Needle Placement: Articular  Findings:    -Comments: Excellent flow of contrast producing a partial arthrogram.  Procedure Details: The fluoroscope beam was manipulated to achieve the best "true" lateral view possible by squaring off the endplates with cranial and caudal tilt and using varying obliquity to achieve the a view with the longest length of spinous process.   The region overlying the facet joints mentioned above were then localized under fluoroscopic visualization. For each target described below the skin was anesthetized with 1 ml of 1% Lidocaine without epinephrine. The needle was inserted down to the level of the lateral mass of the superior articular process of the  facet joint to be injected. Then, the needle was "walked off" inferiorly into the lateral aspect of the facet joint. Bi-planar images were used for confirming placement and spot radiographs were documented. Radiographs were obtained of the arthrogram. A 0.5 ml. volume of the steroid/anesthetic solution was injected into the joint. This procedure was repeated for each facet joint injected.   Additional Comments:  The patient tolerated  the procedure well Dressing: Band-Aid    Post-procedure details: Patient was observed during the procedure. Post-procedure instructions were reviewed.  Patient left the clinic in stable condition.

## 2018-04-21 ENCOUNTER — Ambulatory Visit (INDEPENDENT_AMBULATORY_CARE_PROVIDER_SITE_OTHER): Payer: Self-pay | Admitting: Family Medicine

## 2018-05-06 ENCOUNTER — Ambulatory Visit (INDEPENDENT_AMBULATORY_CARE_PROVIDER_SITE_OTHER): Payer: Self-pay | Admitting: Family Medicine

## 2018-05-10 ENCOUNTER — Ambulatory Visit: Payer: BC Managed Care – PPO | Admitting: Family Medicine

## 2018-06-25 ENCOUNTER — Ambulatory Visit (INDEPENDENT_AMBULATORY_CARE_PROVIDER_SITE_OTHER): Payer: BC Managed Care – PPO | Admitting: Family Medicine

## 2018-06-25 ENCOUNTER — Encounter: Payer: Self-pay | Admitting: Family Medicine

## 2018-06-25 ENCOUNTER — Other Ambulatory Visit: Payer: Self-pay

## 2018-06-25 DIAGNOSIS — M25562 Pain in left knee: Secondary | ICD-10-CM | POA: Diagnosis not present

## 2018-06-25 DIAGNOSIS — R222 Localized swelling, mass and lump, trunk: Secondary | ICD-10-CM | POA: Diagnosis not present

## 2018-06-25 DIAGNOSIS — M899 Disorder of bone, unspecified: Secondary | ICD-10-CM | POA: Diagnosis not present

## 2018-06-25 DIAGNOSIS — M25561 Pain in right knee: Secondary | ICD-10-CM

## 2018-06-25 NOTE — Progress Notes (Signed)
   Office Visit Note   Patient: Brooke Gonzalez           Date of Birth: July 06, 1965           MRN: [id number] Visit Date: 06/25/2018 Requested by: Lujean Amel, MD [address],  [place] [address] PCP: Lujean Amel, MD  Subjective: Chief Complaint  Patient presents with  . Middle Back - Mass    Has been there x years. Not painful.  . Right Knee - Pain  . Left Knee - Pain    HPI: She is here with a couple concerns.  Both of her knees are bothering her again.  Cortisone injections helped quite a bit until a few weeks ago.  She noticed a lump on her lower back.  She thinks it is been there for years, but recently somebody could get short of it because they thought it was getting bigger.  She would like to have it removed.  It does not hurt, she has no worrisome symptoms.              ROS: No fevers or chills.  All other systems were reviewed and are negative.  Objective: Vital Signs: There were no vitals taken for this visit.  Physical Exam:  General:  Alert and oriented, in no acute distress. Pulm:  Breathing unlabored. Psy:  Normal mood, congruent affect. Skin: No rash on the skin. Low back: To the left of midline in the upper lumbar area there is what feels like a lipoma, smooth margins measuring roughly 6 cm superior to inferior and about 4 to 5 cm medial to lateral.  Freely mobile. Knees: Trace effusion in both knees.  Both knees have tenderness around the patellofemoral joint.   Imaging: None today.  Assessment & Plan: 1.  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 -Discussed with patient and she wants to have both knees injected again today.  Follow-up as needed.  2.  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 Erlinda Hong.  He elected to proceed with MRI scan to make sure is not a liposarcoma, and she will follow-up with him afterwards to discuss surgical removal.     Procedures: No procedures performed   No notes on file     PMFS History: There are no active problems to display for this patient.  Past Medical History:  Diagnosis Date  . Hypertension     History reviewed. No pertinent family history.  History reviewed. No pertinent surgical history. Social History   Occupational History  . Not on file  Tobacco Use  . Smoking status: Not on file  Substance and Sexual Activity  . Alcohol use: Not on file  . Drug use: Not on file  . Sexual activity: Not on file

## 2018-07-21 ENCOUNTER — Ambulatory Visit
Admission: RE | Admit: 2018-07-21 | Discharge: 2018-07-21 | Disposition: A | Discharge status: Home / Self Care | Payer: BC Managed Care – PPO | Source: Ambulatory Visit | Attending: Family Medicine | Admitting: Family Medicine

## 2018-07-21 ENCOUNTER — Telehealth: Payer: Self-pay | Admitting: Family Medicine

## 2018-07-21 DIAGNOSIS — M899 Disorder of bone, unspecified: Secondary | ICD-10-CM

## 2018-07-21 NOTE — Telephone Encounter (Signed)
MRI shows that the palpable mass is a benign appearing lipoma, which is good.  I will request an appointment with Dr. Erlinda Hong for possible removal as we discussed.

## 2018-07-22 NOTE — Telephone Encounter (Signed)
I advised the patient of the MRI results. She needs to check her schedule and she will call back to schedule the surgical consult with Dr. Erlinda Hong.

## 2018-11-10 ENCOUNTER — Other Ambulatory Visit: Payer: Self-pay | Admitting: Chiropractic Medicine

## 2018-11-10 ENCOUNTER — Ambulatory Visit
Admission: RE | Admit: 2018-11-10 | Discharge: 2018-11-10 | Disposition: A | Discharge status: Home / Self Care | Payer: BC Managed Care – PPO | Source: Ambulatory Visit | Attending: Chiropractic Medicine | Admitting: Chiropractic Medicine

## 2018-11-10 DIAGNOSIS — M546 Pain in thoracic spine: Secondary | ICD-10-CM

## 2018-11-10 DIAGNOSIS — M5489 Other dorsalgia: Secondary | ICD-10-CM

## 2018-11-10 DIAGNOSIS — M542 Cervicalgia: Secondary | ICD-10-CM

## 2018-11-10 DIAGNOSIS — R52 Pain, unspecified: Secondary | ICD-10-CM

## 2018-11-11 ENCOUNTER — Encounter: Payer: Self-pay | Admitting: Family Medicine

## 2018-11-11 ENCOUNTER — Ambulatory Visit: Payer: BC Managed Care – PPO | Admitting: Family Medicine

## 2018-11-11 ENCOUNTER — Other Ambulatory Visit: Payer: Self-pay

## 2018-11-11 ENCOUNTER — Telehealth: Payer: Self-pay

## 2018-11-11 DIAGNOSIS — M1712 Unilateral primary osteoarthritis, left knee: Secondary | ICD-10-CM | POA: Diagnosis not present

## 2018-11-11 DIAGNOSIS — M1711 Unilateral primary osteoarthritis, right knee: Secondary | ICD-10-CM

## 2018-11-11 MED ORDER — POTASSIUM CHLORIDE CRYS ER 10 MEQ PO TBCR: 10.0000 meq | EXTENDED_RELEASE_TABLET | Freq: Two times a day (BID) | ORAL | 6 refills | Status: DC | PRN

## 2018-11-11 NOTE — Telephone Encounter (Signed)
-----   Message from Eunice Blase, MD sent at 11/11/2018  9:30 AM EST ----- Regarding: Gel injections Please request approval for bilateral knee gel injections.

## 2018-11-11 NOTE — Progress Notes (Signed)
   Office Visit Note   Patient: Brooke Gonzalez           Date of Birth: 1965/03/07           MRN: [id number] Visit Date: 11/11/2018 Requested by: Lujean Amel, MD [place],  [place] [address] PCP: Lujean Amel, MD  Subjective: Chief Complaint  Patient presents with  . Right Knee - Pain    Pain both knees, right greater than left.  . Left Knee - Pain    HPI: She is here with bilateral knee pain.  Last injections unfortunately did not help at all.  She is very reluctant to consider knee replacement, and would like to try gel injections if possible.  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  Incidentally, she does not yet have a PCP and would like to establish here.  Currently she needs a refill of potassium.               ROS: No fevers or chills.  All other systems were reviewed and are negative.  Objective: Vital Signs: There were no vitals taken for this visit.  Physical Exam:  General:  Alert and oriented, in no acute distress. Pulm:  Breathing unlabored. Psy:  Normal mood, congruent affect. Skin: No rash or erythema. Knees: 1+ effusion bilaterally, fullness in the popliteal fossa of both knees.  2+ patellofemoral crepitus, full extension and flexion of 130 degrees.  Moderately tender on the medial joint lines.  Imaging: None today.  Assessment & Plan: 1.  Bilateral knee osteoarthritis -We will request approval for bilateral gel injections.     Procedures: No procedures performed  No notes on file     PMFS History: There are no active problems to display for this patient.  Past Medical History:  Diagnosis Date  . Hypertension     History reviewed. No pertinent family history.  History reviewed. No pertinent surgical history. Social History   Occupational History  . Not on file  Tobacco Use  . Smoking status: Not on file  Substance and Sexual Activity  . Alcohol use: Not on  file  . Drug use: Not on file  . Sexual activity: Not on file

## 2018-11-15 NOTE — Telephone Encounter (Signed)
PA required, faxed in today.    $80 copay, buy and bill ok.   

## 2018-11-15 NOTE — Telephone Encounter (Signed)
Submitted online [id number] for Durolane bilateral knees.  

## 2018-11-18 NOTE — Telephone Encounter (Signed)
Durolane bilateral knees, Dillon Bjork #[id number], valid 11/17/18 thru 11/17/19, 120 units.

## 2018-11-18 NOTE — Telephone Encounter (Signed)
Can you please call patient and schedule an appt with [facility] for bilateral knee Durolane injections?  She will have an $80 copay due on date of service, insurance should cover all else.  Thanks.

## 2018-11-19 ENCOUNTER — Telehealth: Payer: Self-pay | Admitting: Family Medicine

## 2018-11-19 NOTE — Telephone Encounter (Signed)
Called patient left message on voicemail to return call to schedule an appointment with Dr Junius Roads for Bil knee Durolane injection   Co-pay $80.00

## 2018-11-22 ENCOUNTER — Encounter: Payer: Self-pay | Admitting: Family Medicine

## 2018-11-22 ENCOUNTER — Ambulatory Visit (INDEPENDENT_AMBULATORY_CARE_PROVIDER_SITE_OTHER): Payer: BC Managed Care – PPO | Admitting: Family Medicine

## 2018-11-22 ENCOUNTER — Ambulatory Visit: Payer: Self-pay

## 2018-11-22 ENCOUNTER — Other Ambulatory Visit: Payer: Self-pay

## 2018-11-22 DIAGNOSIS — M1711 Unilateral primary osteoarthritis, right knee: Secondary | ICD-10-CM | POA: Diagnosis not present

## 2018-11-22 DIAGNOSIS — M1712 Unilateral primary osteoarthritis, left knee: Secondary | ICD-10-CM

## 2018-11-22 NOTE — Progress Notes (Signed)
   Office Visit Note   Patient: Brooke Gonzalez           Date of Birth: Jan 06, 1966           MRN: [id number] Visit Date: 11/22/2018 Requested by: Eunice Blase, MD [address] [place],  [place] [address] PCP: Eunice Blase, MD  Subjective: No chief complaint on file.   HPI: She is here for planned bilateral Durolane injections for left and right knee osteoarthritis.              ROS: No fevers or chills.  All other systems were reviewed and are negative.  Objective: Vital Signs: There were no vitals taken for this visit.  Physical Exam:  General:  Alert and oriented, in no acute distress. Pulm:  Breathing unlabored. Psy:  Normal mood, congruent affect. Skin: No rash or erythema. Knees: Trace effusion bilaterally.  Both knees are tender on the medial joint line.  Imaging: None today.  Assessment & Plan: 1.  Bilateral knee osteoarthritis -Durolane injection into each knee today.  Follow-up as needed.     Procedures: Ultrasound-guided bilateral knee injections: Ultrasound was used to ensure intra-articular placement.  After sterile prep with Betadine, injected 5 cc 1% lidocaine without epinephrine and dural Lane from superolateral approach left knee and superomedial approach right knee, into the joint recess.    PMFS History: There are no active problems to display for this patient.  Past Medical History:  Diagnosis Date  . Hypertension     History reviewed. No pertinent family history.  History reviewed. No pertinent surgical history. Social History   Occupational History  . Not on file  Tobacco Use  . Smoking status: Not on file  Substance and Sexual Activity  . Alcohol use: Not on file  . Drug use: Not on file  . Sexual activity: Not on file

## 2019-06-20 ENCOUNTER — Ambulatory Visit: Payer: BC Managed Care – PPO | Admitting: Family Medicine

## 2019-07-01 ENCOUNTER — Encounter (INDEPENDENT_AMBULATORY_CARE_PROVIDER_SITE_OTHER): Payer: Self-pay | Admitting: Physical Medicine and Rehabilitation
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
## 2019-08-31 ENCOUNTER — Ambulatory Visit: Payer: BC Managed Care – PPO | Admitting: Family Medicine

## 2019-09-02 ENCOUNTER — Ambulatory Visit: Payer: BC Managed Care – PPO | Admitting: Family Medicine

## 2019-09-06 ENCOUNTER — Encounter: Payer: Self-pay | Admitting: Family Medicine

## 2019-09-06 ENCOUNTER — Ambulatory Visit: Payer: BC Managed Care – PPO | Admitting: Family Medicine

## 2019-09-06 ENCOUNTER — Other Ambulatory Visit: Payer: Self-pay

## 2019-09-06 DIAGNOSIS — M1711 Unilateral primary osteoarthritis, right knee: Secondary | ICD-10-CM | POA: Diagnosis not present

## 2019-09-06 DIAGNOSIS — M1712 Unilateral primary osteoarthritis, left knee: Secondary | ICD-10-CM | POA: Diagnosis not present

## 2019-09-06 NOTE — Progress Notes (Signed)
I saw and examined the patient with Dr. Marga Hoots and agree with assessment and plan as outlined.    Bilateral knee pain for the past few weeks since doing a drill at work which required dropping to knees repetitively.  Has had swelling and pain since then.  Went to [facility] physician for x-rays, was given prednisone which helped some.  Last saw her in November 2020 and did Durolane injections.  Knees were doing well until this recent event.  Exam shows 1+ effusion in both knees with 2+ patellofemoral crepitus.  Will request approval for bilateral gel injections.  She will take OTC naproxen until then.

## 2019-09-06 NOTE — Progress Notes (Signed)
Office Visit Note   Patient: Brooke Gonzalez           Date of Birth: 07/10/1965           MRN: [id number] Visit Date: 09/06/2019 Requested by: Lavada Mesi, MD [address] [place],  [place] [address] PCP: Lavada Mesi, MD  Subjective: Chief Complaint  Patient presents with  . Right Knee - Pain    Would like an injection, was hurt at work, swelling, aching, had xrays done, was told knee cap was slanted, felt like a migraine in the back of knees.  . Left Knee - Pain    HPI: 54 year old female presenting to clinic today with acute on chronic bilateral knee pain.  Patient with a history of patellar arthritis, for which she has tried cortisone injections as well as viscosupplementation.  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  After this activity, she noticed severe pain and swelling in both of her knees.  She was seen by the clinic at work, where she was given prednisone-which she states offered a significant improvement in her pain and swelling.  Since this time, her pain has continued to improve, and she presents today for follow-up.  At her last encounter (November 2020) she underwent gel injections of both of her knees, and states that this offered several months of pain relief until this recent exercise.  She states that she would be interested in repeating this procedure.                ROS:   All other systems were reviewed and are negative.  Objective: Vital Signs: There were no vitals taken for this visit.  Physical Exam:  General:  Alert and oriented, in no acute distress. Pulm:  Breathing unlabored. Psy:  Normal mood, congruent affect. Skin:  No bruises or rashes appreciated  Knees:  Normal gait. Bilateral knees with no varus or valgus deformity.  Seated Examination:  Full ROM with knee flexion/extension bilaterally, with significant patellar crepitus. No obvious  J-sign.  No Medial or lateral joint line tenderness. No tenderness with palpation of patellar tendon.   Tenderness to palpation of patellar facets, and pain with patellar compression.   Ligamentous Exam:  No Laxity or pain with Anterior/Posterior drawer.  No Laxity or Pain with varus/valgus stress across the knee.   Meniscal exam:  McMurray with no pain of deep clicking.   Imaging: None Today.  Assessment & Plan: 54 year old female presenting to clinic with acute on chronic bilateral knee pain after kneeling exercises at work.  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  -Reassuring that her symptoms have improved since her initial insult.  Discussed continued conservative therapy to allow more time for healing and to see if she will return to baseline on her own, versus repeated gel injections.  Given success of gel injections in the past, patient would like to try this again. -We will submit for insurance approval of gel injections, and schedule for procedure pending this approval. -Patient is agreeable with plan, and has no further questions today.     Procedures: No procedures performed  No notes on file     PMFS History: There are no problems to display for this patient.  Past Medical History:  Diagnosis Date  . Hypertension     No family history on file.  No  past surgical history on file. Social History   Occupational History  . Not on file  Tobacco Use  . Smoking status: Not on file  Substance and Sexual Activity  . Alcohol use: Not on file  . Drug use: Not on file  . Sexual activity: Not on file

## 2019-09-07 NOTE — Progress Notes (Signed)
Noted  

## 2019-09-09 ENCOUNTER — Telehealth: Payer: Self-pay

## 2019-09-09 NOTE — Telephone Encounter (Signed)
Submitted VOB, Durolane, bilateral knee. 

## 2019-09-20 ENCOUNTER — Telehealth: Payer: Self-pay

## 2019-09-20 NOTE — Telephone Encounter (Signed)
Called and left a VM advising patient to call back to schedule an appointment with Dr. Prince Rome for gel injection.  Approved, Durolane, bilateral knee. Buy & Bill Covered at 100% of the allowable amount. Co-pay of $80.00 required PA required PA Approval# [id number] Valid 09/13/2019- 03/12/2020

## 2019-10-04 ENCOUNTER — Encounter: Payer: Self-pay | Admitting: Family Medicine

## 2019-10-04 ENCOUNTER — Ambulatory Visit: Payer: BC Managed Care – PPO | Admitting: Family Medicine

## 2019-10-04 ENCOUNTER — Other Ambulatory Visit: Payer: Self-pay

## 2019-10-04 DIAGNOSIS — M1712 Unilateral primary osteoarthritis, left knee: Secondary | ICD-10-CM

## 2019-10-04 DIAGNOSIS — M1711 Unilateral primary osteoarthritis, right knee: Secondary | ICD-10-CM | POA: Diagnosis not present

## 2019-10-04 NOTE — Progress Notes (Signed)
   Office Visit Note   Patient: Brooke Gonzalez           Date of Birth: 03/26/1965           MRN: [id number] Visit Date: 10/04/2019 Requested by: Lavada Mesi, MD [address] [place],  [place] [address] PCP: Lavada Mesi, MD  Subjective: Chief Complaint  Patient presents with  . Right Knee - Pain    Bilateral Durolane injections  . Left Knee - Pain    HPI: She is here for bilateral knee Durolane injections.              ROS:   All other systems were reviewed and are negative.  Objective: Vital Signs: There were no vitals taken for this visit.  Physical Exam:  General:  Alert and oriented, in no acute distress. Pulm:  Breathing unlabored. Psy:  Normal mood, congruent affect. Skin: No erythema Knees: Trace effusion bilaterally with no warmth.  Good range of motion.  Imaging: No results found.  Assessment & Plan: 1.  Bilateral knee osteoarthritis -Durolane injections today.  Follow-up as needed.     Procedures: Bilateral knee injections: After sterile prep with Betadine, injected 3 cc 1% lidocaine without epinephrine and Durolane from lateral midpatellar approach.    PMFS History: There are no problems to display for this patient.  Past Medical History:  Diagnosis Date  . Hypertension     History reviewed. No pertinent family history.  History reviewed. No pertinent surgical history. Social History   Occupational History  . Not on file  Tobacco Use  . Smoking status: Not on file  Substance and Sexual Activity  . Alcohol use: Not on file  . Drug use: Not on file  . Sexual activity: Not on file

## 2019-11-13 IMAGING — DX DG THORACIC SPINE 3V
3 series · 3 of 3 positions shown · non-contrast
Comparison: None.

CLINICAL DATA: Pain

EXAM:
THORACIC SPINE - 3 VIEWS

[dg thoracic spine w/swimmers (1 of 3)]
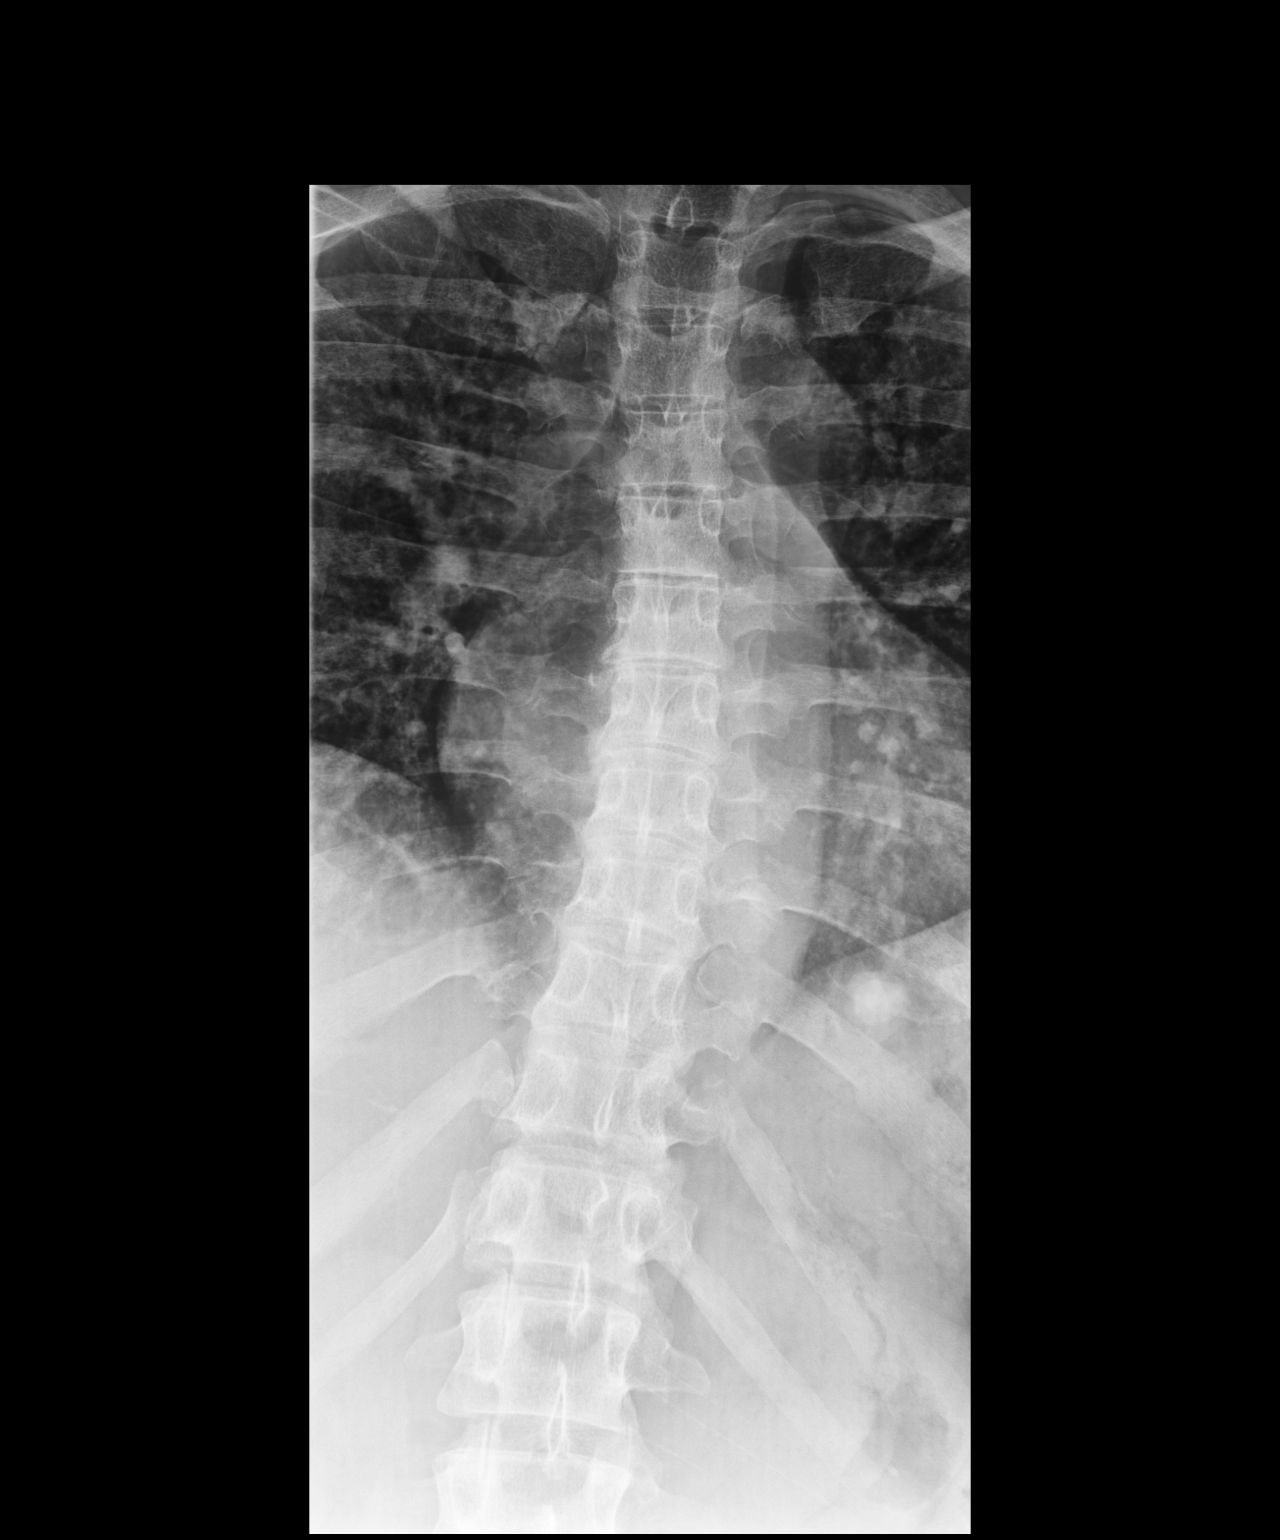

[dg thoracic spine w/swimmers (2 of 3)]
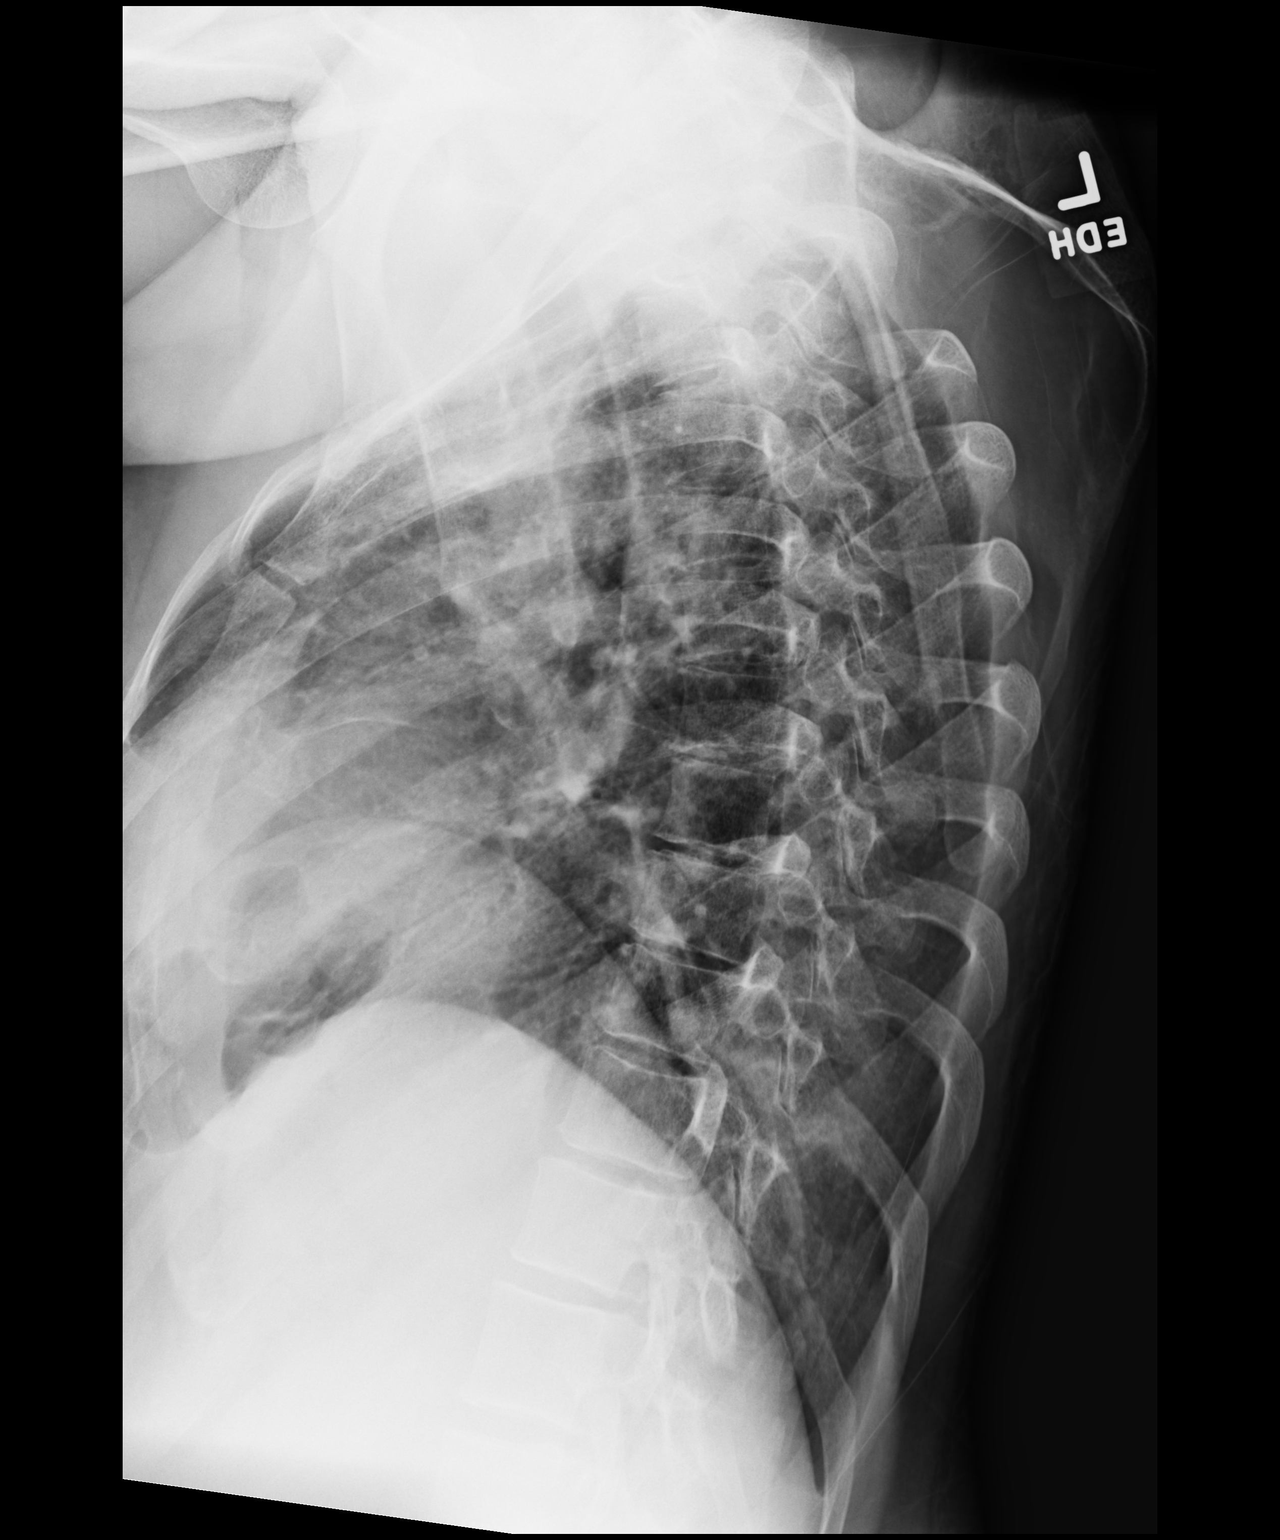

[dg thoracic spine w/swimmers (3 of 3)]
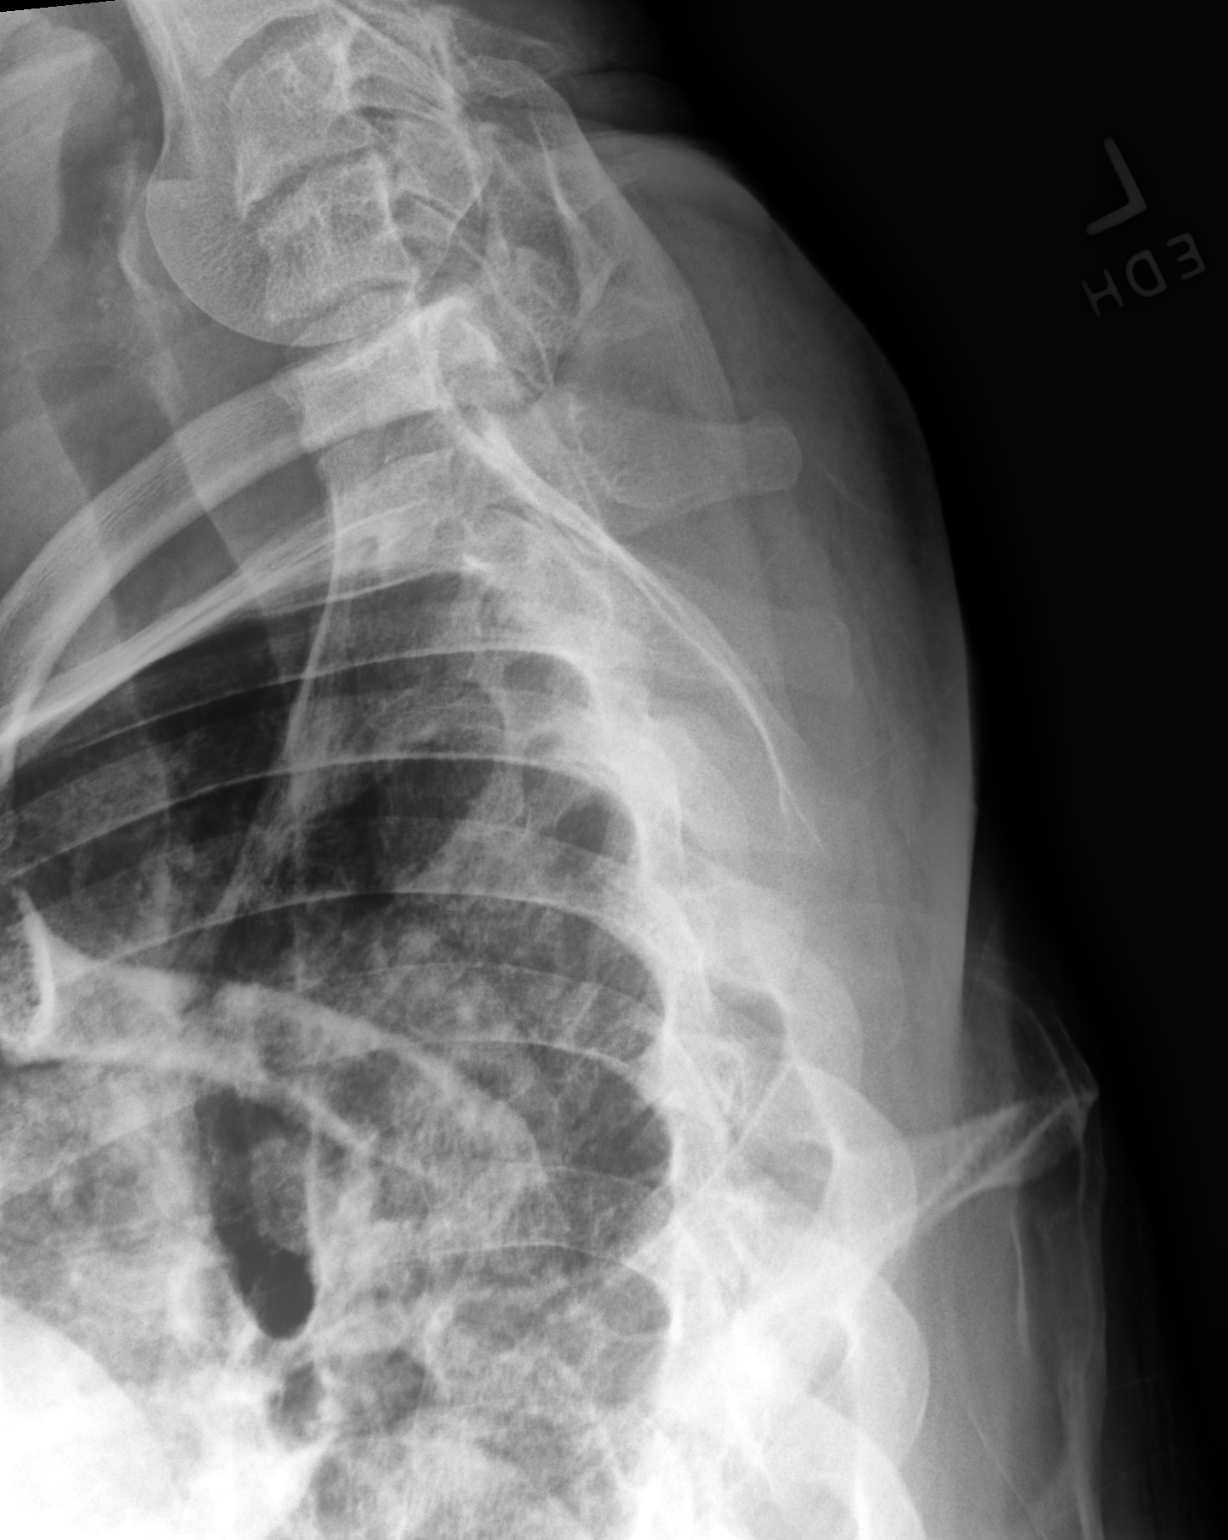

[3 of 3 positions shown; findings below may reference images not displayed]

FINDINGS: There is no evidence of thoracic spine fracture. Alignment is
normal. No other significant bone abnormalities are identified.
IMPRESSION: Negative.

## 2020-02-15 ENCOUNTER — Emergency Department (HOSPITAL_COMMUNITY)
Admission: EM | Admit: 2020-02-15 | Discharge: 2020-02-15 | Disposition: A | Discharge status: Home / Self Care | Payer: BC Managed Care – PPO | Attending: Emergency Medicine | Admitting: Emergency Medicine

## 2020-02-15 ENCOUNTER — Other Ambulatory Visit: Payer: Self-pay

## 2020-02-15 ENCOUNTER — Encounter (HOSPITAL_COMMUNITY): Payer: Self-pay

## 2020-02-15 ENCOUNTER — Emergency Department (HOSPITAL_COMMUNITY): Payer: BC Managed Care – PPO

## 2020-02-15 DIAGNOSIS — R0789 Other chest pain: Secondary | ICD-10-CM | POA: Insufficient documentation

## 2020-02-15 DIAGNOSIS — R110 Nausea: Secondary | ICD-10-CM | POA: Insufficient documentation

## 2020-02-15 DIAGNOSIS — Z5321 Procedure and treatment not carried out due to patient leaving prior to being seen by health care provider: Secondary | ICD-10-CM | POA: Diagnosis not present

## 2020-02-15 LAB — CBC
HCT: 40.9 % (ref 36.0–46.0)
Hemoglobin: 13.2 g/dL (ref 12.0–15.0)
MCH: 28.6 pg (ref 26.0–34.0)
MCHC: 32.3 g/dL (ref 30.0–36.0)
MCV: 88.7 fL (ref 80.0–100.0)
Platelets: 261 10*3/uL (ref 150–400)
RBC: 4.61 MIL/uL (ref 3.87–5.11)
RDW: 13.8 % (ref 11.5–15.5)
WBC: 4.5 10*3/uL (ref 4.0–10.5)
nRBC: 0 % (ref 0.0–0.2)

## 2020-02-15 LAB — BASIC METABOLIC PANEL
Anion gap: 11 (ref 5–15)
BUN: 14 mg/dL (ref 6–20)
CO2: 24 mmol/L (ref 22–32)
Calcium: 9 mg/dL (ref 8.9–10.3)
Chloride: 106 mmol/L (ref 98–111)
Creatinine, Ser: 0.69 mg/dL (ref 0.44–1.00)
GFR, Estimated: >60 mL/min (ref >60)
Glucose, Bld: 108 mg/dL — ABNORMAL HIGH (ref 70–99)
Potassium: 3.5 mmol/L (ref 3.5–5.1)
Sodium: 141 mmol/L (ref 135–145)

## 2020-02-15 LAB — I-STAT BETA HCG BLOOD, ED (NOT ORDERABLE): I-stat hCG, quantitative: <5 m[IU]/mL (ref <5)

## 2020-02-15 LAB — TROPONIN I (HIGH SENSITIVITY): Troponin I (High Sensitivity): 5 ng/L (ref <18)

## 2020-02-15 NOTE — ED Triage Notes (Signed)
Patient arrived with stating over the last 40 minutes she has had chest discomfort but not "pain". States she had some nausea. Declines any dizziness or vomiting.

## 2020-10-04 ENCOUNTER — Ambulatory Visit (INDEPENDENT_AMBULATORY_CARE_PROVIDER_SITE_OTHER): Payer: BC Managed Care – PPO

## 2020-10-04 ENCOUNTER — Ambulatory Visit: Payer: BC Managed Care – PPO | Admitting: Sports Medicine

## 2020-10-04 ENCOUNTER — Encounter: Payer: Self-pay | Admitting: Sports Medicine

## 2020-10-04 ENCOUNTER — Other Ambulatory Visit: Payer: Self-pay

## 2020-10-04 ENCOUNTER — Other Ambulatory Visit: Payer: Self-pay | Admitting: Sports Medicine

## 2020-10-04 DIAGNOSIS — M779 Enthesopathy, unspecified: Secondary | ICD-10-CM | POA: Diagnosis not present

## 2020-10-04 DIAGNOSIS — M79671 Pain in right foot: Secondary | ICD-10-CM

## 2020-10-04 DIAGNOSIS — M2141 Flat foot [pes planus] (acquired), right foot: Secondary | ICD-10-CM

## 2020-10-04 DIAGNOSIS — M79672 Pain in left foot: Secondary | ICD-10-CM

## 2020-10-04 DIAGNOSIS — M19079 Primary osteoarthritis, unspecified ankle and foot: Secondary | ICD-10-CM

## 2020-10-04 DIAGNOSIS — M2142 Flat foot [pes planus] (acquired), left foot: Secondary | ICD-10-CM

## 2020-10-04 DIAGNOSIS — M7750 Other enthesopathy of unspecified foot: Secondary | ICD-10-CM

## 2020-10-04 DIAGNOSIS — M7751 Other enthesopathy of right foot: Secondary | ICD-10-CM | POA: Diagnosis not present

## 2020-10-04 DIAGNOSIS — B351 Tinea unguium: Secondary | ICD-10-CM | POA: Diagnosis not present

## 2020-10-04 DIAGNOSIS — M25571 Pain in right ankle and joints of right foot: Secondary | ICD-10-CM

## 2020-10-04 NOTE — Patient Instructions (Signed)
Topical voltaren gel 1%, may purchase OTC to use as needed for pain or discomfort at [facility]  

## 2020-10-04 NOTE — Progress Notes (Signed)
Subjective: Brooke Gonzalez is a 55 y.o. female patient seen today in office with complaint of 1. mildly painful thickened and discolored nails. Patient is desiring treatment for nail changes. Reports that nails are becoming difficult to manage because of the thickness. 2. Admits old right ankle injury >10 years ago and still gets achy pain. 3. Pain to top of the left foot sharp over the last few days. No injury. Patient has no other pedal complaints at this time.   There are no problems to display for this patient.   Current Outpatient Medications on File Prior to Visit  Medication Sig Dispense Refill   amLODipine (NORVASC) 5 MG tablet Take 5 mg by mouth daily.     amLODipine (NORVASC) 5 MG tablet Take 1 tablet by mouth daily.     etodolac (LODINE) 400 MG tablet Take 1 tablet (400 mg total) by mouth 2 (two) times daily as needed. 60 tablet 3   hydrochlorothiazide (MICROZIDE) 12.5 MG capsule Take 12.5 mg by mouth daily.     potassium chloride (KLOR-CON) 10 MEQ tablet Take 1 tablet (10 mEq total) by mouth 2 (two) times daily as needed. 1 PO BID when taking lasix 60 tablet 6   predniSONE (DELTASONE) 20 MG tablet 3 pills for 3 days, 2 pills for 3 days, 1 pill for 3 days 1/2 pill for 3 days.  Do not take with NSAID'S.  Take in the am with food     triamcinolone acetonide (KENALOG-40) 40 MG/ML injection (RADIOLOGY ONLY) Inject into the articular space.     XIIDRA 5 % SOLN Apply 1 drop to eye 2 (two) times daily.     No current facility-administered medications on file prior to visit.    Allergies  Allergen Reactions   Morphine Nausea And Vomiting   Iron     "my body doesn't store it"    Objective: Physical Exam  General: Well developed, nourished, no acute distress, awake, alert and oriented x 3  Vascular: Dorsalis pedis artery 2/4 bilateral, Posterior tibial artery 1/4 bilateral, skin temperature warm to warm proximal to distal bilateral lower extremities, no varicosities, pedal hair  present bilateral.  Neurological: Gross sensation present via light touch bilateral.   Dermatological: Skin is warm, dry, and supple bilateral, Nails 1-5 on left and 1-3 on right are tender, short thick, and discolored with mild subungal debris, no webspace macerations present bilateral, no open lesions present bilateral, no callus/corns/hyperkeratotic tissue present bilateral. No signs of infection bilateral.  Musculoskeletal: Bunion, pes planus boney deformities noted bilateral. Mild pain to top of left foot and lateral right ankle with no instability. Muscular strength within normal limits without painon range of motion. No pain with calf compression bilateral.   Xrays right show joint space narrowing consistent with arthritis, no acute fracture bilateral, bunion and pes planus deformity noted.   Assessment and Plan:  Problem List Items Addressed This Visit   None Visit Diagnoses     Onychomycosis    -  Primary   Relevant Orders   Culture, fungus without smear   Pain in both feet       Arthritis of ankle       Tendonitis       Pain in joint involving right ankle and foot       Left foot pain           -Examined patient -Discussed treatments for bilateral foot pain -Recommend topical voltaren for top of left foot and side of right ankle -  Dispensed surgigrip sleeve to use on right and advised ankle support for chronic issue -Discussed treatment options for painful dystrophic nails  -Fungal culture was obtained by removing a portion of the hard nail itself from each of the involved toenails using a sterile nail nipper and sent to [facility] lab. Patient tolerated the biopsy procedure well without discomfort or need for anesthesia.  -Patient to return in 4 weeks for follow up evaluation and discussion of fungal culture results or sooner if symptoms worsen.  Asencion Islam, DPM

## 2020-11-01 ENCOUNTER — Other Ambulatory Visit: Payer: Self-pay

## 2020-11-01 ENCOUNTER — Ambulatory Visit: Payer: BC Managed Care – PPO | Admitting: Sports Medicine

## 2020-11-01 DIAGNOSIS — B351 Tinea unguium: Secondary | ICD-10-CM | POA: Diagnosis not present

## 2020-11-01 DIAGNOSIS — M79671 Pain in right foot: Secondary | ICD-10-CM

## 2020-11-01 DIAGNOSIS — M7742 Metatarsalgia, left foot: Secondary | ICD-10-CM | POA: Diagnosis not present

## 2020-11-01 DIAGNOSIS — M79672 Pain in left foot: Secondary | ICD-10-CM

## 2020-11-01 NOTE — Progress Notes (Signed)
Subjective: Brooke Gonzalez is a 55 y.o. female patient seen today in office for fungal culture results.  Patient also admits that she gets some pain over the left second and third toes has been hurting more often and states that her right ankle does not bother her much very little swelling reports that the Surgigrip has helped sometimes her leg feels tired but otherwise is doing better.  Patient has no other pedal complaints at this time.   There are no problems to display for this patient.   Current Outpatient Medications on File Prior to Visit  Medication Sig Dispense Refill   amLODipine (NORVASC) 5 MG tablet Take 5 mg by mouth daily.     amLODipine (NORVASC) 5 MG tablet Take 1 tablet by mouth daily.     etodolac (LODINE) 400 MG tablet Take 1 tablet (400 mg total) by mouth 2 (two) times daily as needed. 60 tablet 3   hydrochlorothiazide (MICROZIDE) 12.5 MG capsule Take 12.5 mg by mouth daily.     potassium chloride (KLOR-CON) 10 MEQ tablet Take 1 tablet (10 mEq total) by mouth 2 (two) times daily as needed. 1 PO BID when taking lasix 60 tablet 6   predniSONE (DELTASONE) 20 MG tablet 3 pills for 3 days, 2 pills for 3 days, 1 pill for 3 days 1/2 pill for 3 days.  Do not take with NSAID'S.  Take in the am with food     triamcinolone acetonide (KENALOG-40) 40 MG/ML injection (RADIOLOGY ONLY) Inject into the articular space.     XIIDRA 5 % SOLN Apply 1 drop to eye 2 (two) times daily.     No current facility-administered medications on file prior to visit.    Allergies  Allergen Reactions   Morphine Nausea And Vomiting   Iron     "my body doesn't store it"    Objective: Physical Exam  General: Well developed, nourished, no acute distress, awake, alert and oriented x 3  Vascular: Dorsalis pedis artery 2/4 bilateral, Posterior tibial artery 1/4 bilateral, skin temperature warm to warm proximal to distal bilateral lower extremities, no varicosities, pedal hair present  bilateral.  Neurological: Gross sensation present via light touch bilateral.   Dermatological: Skin is warm, dry, and supple bilateral, Nails 1-10 are tender, short thick, and discolored with mild subungal debris, no webspace macerations present bilateral, no open lesions present bilateral, no callus/corns/hyperkeratotic tissue present bilateral. No signs of infection bilateral.  Musculoskeletal: Pain with palpation to the second and third metatarsal phalangeal joints on the left with long second toe and significant bunion and hammertoe deformity.  There is minimal pain or pedal complaints noted to the right foot and ankle at this time.  Pes planus foot type bilateral.  Fungal culture + microtrauma and Trichophyton rubrum  Assessment and Plan:  Problem List Items Addressed This Visit   None Visit Diagnoses     Onychomycosis    -  Primary   Relevant Orders   Hepatic Function Panel   Metatarsalgia, left foot       Pain in both feet           -Examined patient -Discussed treatment options for painful mycotic nails -Patient opt for oral Lamisil with full understanding of medication risks; ordered LFTs for review if within normal limits will proceed with sending Rx to pharmacy for lamisil 250mg  PO daily. Anticipate 12 week course.  -Patient also elects for laser office to schedule patient for laser treatment for nail fungus -Advised good hygiene habits -  Patient declined further work-up and steroid injection for the left foot at this time -Dispensed metatarsal sleeve with additional padding for the ball of the foot and advised patient if this works well may benefit long-term from orthotics -Patient to return in 6 weeks for follow up evaluation or sooner if symptoms worsen.  Asencion Islam, DPM

## 2020-11-02 ENCOUNTER — Other Ambulatory Visit: Payer: Self-pay | Admitting: Sports Medicine

## 2020-11-02 LAB — HEPATIC FUNCTION PANEL
AG Ratio: 1.4 (calc) (ref 1.0–2.5)
ALT: 12 U/L (ref 6–29)
AST: 15 U/L (ref 10–35)
Albumin: 4.1 g/dL (ref 3.6–5.1)
Alkaline phosphatase (APISO): 107 U/L (ref 37–153)
Bilirubin, Direct: 0.1 mg/dL (ref 0.0–0.2)
Globulin: 2.9 g/dL (calc) (ref 1.9–3.7)
Indirect Bilirubin: 0.4 mg/dL (calc) (ref 0.2–1.2)
Total Bilirubin: 0.5 mg/dL (ref 0.2–1.2)
Total Protein: 7 g/dL (ref 6.1–8.1)

## 2020-11-02 MED ORDER — TERBINAFINE HCL 250 MG PO TABS: 250.0000 mg | ORAL_TABLET | Freq: Every day | ORAL | 0 refills | Status: AC

## 2020-11-02 NOTE — Progress Notes (Signed)
Lamisil sent . LFTs normal.

## 2020-11-16 ENCOUNTER — Other Ambulatory Visit: Payer: BC Managed Care – PPO

## 2020-12-13 ENCOUNTER — Other Ambulatory Visit: Payer: Self-pay

## 2020-12-13 ENCOUNTER — Ambulatory Visit: Payer: BC Managed Care – PPO | Admitting: Sports Medicine

## 2020-12-13 ENCOUNTER — Encounter (HOSPITAL_COMMUNITY): Payer: Self-pay | Admitting: Surgery

## 2020-12-13 NOTE — Progress Notes (Signed)
SDW CALL  Patient was given pre-op instructions over the phone. The opportunity was given for the patient to ask questions. No further questions asked. Patient verbalized understanding of instructions given.   PCP - Dr. Docia Chuck at [facility] Cardiologist - n/a  PPM/ICD - n/a  Chest x-ray - 02/15/20 EKG - 02/15/20 Stress Test - denies ECHO - denies Cardiac Cath - denies  Sleep Study - denies    ERAS Protcol - clears until 0600  COVID TEST- ambulatory surgery   Anesthesia review:  n/a  Patient denies shortness of breath, fever, cough and chest pain over the phone call  Patient states that significant other will be taking her home and can stay with her for the first 24 hours.    All instructions explained to the patient, with a verbal understanding of the material. Patient agrees to go over the instructions while at home for a better understanding.

## 2020-12-14 ENCOUNTER — Other Ambulatory Visit: Payer: Self-pay

## 2020-12-14 ENCOUNTER — Encounter (HOSPITAL_COMMUNITY)
Admission: RE | Disposition: A | Discharge status: Home / Self Care | Payer: Self-pay | Source: Ambulatory Visit | Attending: Surgery

## 2020-12-14 ENCOUNTER — Encounter (HOSPITAL_COMMUNITY): Payer: Self-pay | Admitting: Surgery

## 2020-12-14 ENCOUNTER — Ambulatory Visit (HOSPITAL_COMMUNITY): Payer: BC Managed Care – PPO | Admitting: Anesthesiology

## 2020-12-14 ENCOUNTER — Ambulatory Visit (HOSPITAL_COMMUNITY)
Admission: RE | Admit: 2020-12-14 | Discharge: 2020-12-14 | Disposition: A | Discharge status: Home / Self Care | Payer: BC Managed Care – PPO | Source: Ambulatory Visit | Attending: Surgery | Admitting: Surgery

## 2020-12-14 DIAGNOSIS — R222 Localized swelling, mass and lump, trunk: Secondary | ICD-10-CM | POA: Insufficient documentation

## 2020-12-14 DIAGNOSIS — I10 Essential (primary) hypertension: Secondary | ICD-10-CM | POA: Diagnosis not present

## 2020-12-14 HISTORY — DX: Personal history of other diseases of the respiratory system: Z87.09

## 2020-12-14 HISTORY — PX: LIPOMA EXCISION: SHX5283

## 2020-12-14 LAB — BASIC METABOLIC PANEL
Anion gap: 7 (ref 5–15)
BUN: 11 mg/dL (ref 6–20)
CO2: 28 mmol/L (ref 22–32)
Calcium: 9 mg/dL (ref 8.9–10.3)
Chloride: 104 mmol/L (ref 98–111)
Creatinine, Ser: 0.68 mg/dL (ref 0.44–1.00)
GFR, Estimated: >60 mL/min (ref >60)
Glucose, Bld: 97 mg/dL (ref 70–99)
Potassium: 3.2 mmol/L — ABNORMAL LOW (ref 3.5–5.1)
Sodium: 139 mmol/L (ref 135–145)

## 2020-12-14 SURGERY — EXCISION LIPOMA
Anesthesia: General | Laterality: Bilateral

## 2020-12-14 MED ORDER — DEXAMETHASONE SODIUM PHOSPHATE 10 MG/ML IJ SOLN
INTRAMUSCULAR | Status: DC | PRN
  Administered 2020-12-14: 4 mg via INTRAVENOUS

## 2020-12-14 MED ORDER — BUPIVACAINE-EPINEPHRINE (PF) 0.25% -1:200000 IJ SOLN
INTRAMUSCULAR | Status: AC
  Filled 2020-12-14: qty 30

## 2020-12-14 MED ORDER — FENTANYL CITRATE (PF) 100 MCG/2ML IJ SOLN
INTRAMUSCULAR | Status: AC
  Filled 2020-12-14: qty 2

## 2020-12-14 MED ORDER — ROCURONIUM BROMIDE 10 MG/ML (PF) SYRINGE
PREFILLED_SYRINGE | INTRAVENOUS | Status: DC | PRN
  Administered 2020-12-14: 60 mg via INTRAVENOUS

## 2020-12-14 MED ORDER — CEFAZOLIN SODIUM-DEXTROSE 2-3 GM-%(50ML) IV SOLR
INTRAVENOUS | Status: DC | PRN
  Administered 2020-12-14: 2 g via INTRAVENOUS

## 2020-12-14 MED ORDER — LACTATED RINGERS IV SOLN: INTRAVENOUS | Status: DC

## 2020-12-14 MED ORDER — PHENYLEPHRINE 40 MCG/ML (10ML) SYRINGE FOR IV PUSH (FOR BLOOD PRESSURE SUPPORT)
PREFILLED_SYRINGE | INTRAVENOUS | Status: AC
  Filled 2020-12-14: qty 10

## 2020-12-14 MED ORDER — ONDANSETRON HCL 4 MG/2ML IJ SOLN
INTRAMUSCULAR | Status: DC | PRN
  Administered 2020-12-14: 4 mg via INTRAVENOUS

## 2020-12-14 MED ORDER — LIDOCAINE 2% (20 MG/ML) 5 ML SYRINGE
INTRAMUSCULAR | Status: DC | PRN
  Administered 2020-12-14: 100 mg via INTRAVENOUS

## 2020-12-14 MED ORDER — PHENYLEPHRINE 40 MCG/ML (10ML) SYRINGE FOR IV PUSH (FOR BLOOD PRESSURE SUPPORT)
PREFILLED_SYRINGE | INTRAVENOUS | Status: DC | PRN
  Administered 2020-12-14 (×5): 80 ug via INTRAVENOUS

## 2020-12-14 MED ORDER — ROCURONIUM BROMIDE 10 MG/ML (PF) SYRINGE
PREFILLED_SYRINGE | INTRAVENOUS | Status: AC
  Filled 2020-12-14: qty 10

## 2020-12-14 MED ORDER — MIDAZOLAM HCL 5 MG/5ML IJ SOLN
INTRAMUSCULAR | Status: DC | PRN
  Administered 2020-12-14: 2 mg via INTRAVENOUS

## 2020-12-14 MED ORDER — FENTANYL CITRATE (PF) 250 MCG/5ML IJ SOLN
INTRAMUSCULAR | Status: DC | PRN
  Administered 2020-12-14: 100 ug via INTRAVENOUS
  Administered 2020-12-14: 50 ug via INTRAVENOUS

## 2020-12-14 MED ORDER — DEXAMETHASONE SODIUM PHOSPHATE 10 MG/ML IJ SOLN
INTRAMUSCULAR | Status: AC
  Filled 2020-12-14: qty 1

## 2020-12-14 MED ORDER — ORAL CARE MOUTH RINSE: 15.0000 mL | Freq: Once | OROMUCOSAL | Status: AC

## 2020-12-14 MED ORDER — SUGAMMADEX SODIUM 200 MG/2ML IV SOLN
INTRAVENOUS | Status: DC | PRN
  Administered 2020-12-14: 200 mg via INTRAVENOUS

## 2020-12-14 MED ORDER — FENTANYL CITRATE (PF) 250 MCG/5ML IJ SOLN
INTRAMUSCULAR | Status: AC
  Filled 2020-12-14: qty 5

## 2020-12-14 MED ORDER — AMISULPRIDE (ANTIEMETIC) 5 MG/2ML IV SOLN
5.0000 mg | Freq: Once | INTRAVENOUS | Status: AC
  Administered 2020-12-14: 5 mg via INTRAVENOUS

## 2020-12-14 MED ORDER — METHOCARBAMOL 750 MG PO TABS: 750.0000 mg | ORAL_TABLET | Freq: Four times a day (QID) | ORAL | 1 refills | Status: AC

## 2020-12-14 MED ORDER — ONDANSETRON HCL 4 MG/2ML IJ SOLN
INTRAMUSCULAR | Status: AC
  Filled 2020-12-14: qty 2

## 2020-12-14 MED ORDER — 0.9 % SODIUM CHLORIDE (POUR BTL) OPTIME
TOPICAL | Status: DC | PRN
  Administered 2020-12-14: 1000 mL

## 2020-12-14 MED ORDER — DOCUSATE SODIUM 100 MG PO CAPS: 100.0000 mg | ORAL_CAPSULE | Freq: Two times a day (BID) | ORAL | 2 refills | Status: AC

## 2020-12-14 MED ORDER — BUPIVACAINE-EPINEPHRINE 0.25% -1:200000 IJ SOLN
INTRAMUSCULAR | Status: DC | PRN
  Administered 2020-12-14: 30 mL

## 2020-12-14 MED ORDER — OXYCODONE HCL 5 MG PO TABS
5.0000 mg | ORAL_TABLET | Freq: Three times a day (TID) | ORAL | 0 refills | Status: AC | PRN
Start: 2020-12-14 — End: ?

## 2020-12-14 MED ORDER — CHLORHEXIDINE GLUCONATE 0.12 % MT SOLN: 15.0000 mL | Freq: Once | OROMUCOSAL | Status: AC

## 2020-12-14 MED ORDER — FENTANYL CITRATE (PF) 100 MCG/2ML IJ SOLN
25.0000 ug | INTRAMUSCULAR | Status: DC | PRN
  Administered 2020-12-14: 50 ug via INTRAVENOUS

## 2020-12-14 MED ORDER — IBUPROFEN 600 MG PO TABS: 600.0000 mg | ORAL_TABLET | Freq: Four times a day (QID) | ORAL | 1 refills | Status: AC | PRN

## 2020-12-14 MED ORDER — PROPOFOL 10 MG/ML IV BOLUS
INTRAVENOUS | Status: DC | PRN
  Administered 2020-12-14: 150 mg via INTRAVENOUS

## 2020-12-14 MED ORDER — LIDOCAINE 2% (20 MG/ML) 5 ML SYRINGE
INTRAMUSCULAR | Status: AC
  Filled 2020-12-14: qty 5

## 2020-12-14 MED ORDER — MIDAZOLAM HCL 2 MG/2ML IJ SOLN
INTRAMUSCULAR | Status: AC
  Filled 2020-12-14: qty 2

## 2020-12-14 MED ORDER — AMISULPRIDE (ANTIEMETIC) 5 MG/2ML IV SOLN
INTRAVENOUS | Status: AC
  Filled 2020-12-14: qty 2

## 2020-12-14 MED ORDER — CEFAZOLIN SODIUM-DEXTROSE 2-4 GM/100ML-% IV SOLN
INTRAVENOUS | Status: AC
  Filled 2020-12-14: qty 100

## 2020-12-14 MED ORDER — CHLORHEXIDINE GLUCONATE 0.12 % MT SOLN
OROMUCOSAL | Status: AC
  Administered 2020-12-14: 15 mL via OROMUCOSAL
  Filled 2020-12-14: qty 15

## 2020-12-14 SURGICAL SUPPLY — 39 items
BAG COUNTER SPONGE SURGICOUNT (BAG) ×2 IMPLANT
BLADE CLIPPER SURG (BLADE) IMPLANT
CANISTER SUCT 3000ML PPV (MISCELLANEOUS) ×2 IMPLANT
CHLORAPREP W/TINT 26 (MISCELLANEOUS) ×2 IMPLANT
COVER SURGICAL LIGHT HANDLE (MISCELLANEOUS) ×2 IMPLANT
DECANTER SPIKE VIAL GLASS SM (MISCELLANEOUS) ×2 IMPLANT
DERMABOND ADVANCED (GAUZE/BANDAGES/DRESSINGS) ×1
DERMABOND ADVANCED .7 DNX12 (GAUZE/BANDAGES/DRESSINGS) ×1 IMPLANT
DRAPE LAPAROSCOPIC ABDOMINAL (DRAPES) ×2 IMPLANT
DRAPE LAPAROTOMY 100X72 PEDS (DRAPES) IMPLANT
DRSG TEGADERM 4X4.75 (GAUZE/BANDAGES/DRESSINGS) ×2 IMPLANT
DRSG TELFA 3X8 NADH (GAUZE/BANDAGES/DRESSINGS) ×2 IMPLANT
ELECT REM PT RETURN 9FT ADLT (ELECTROSURGICAL) ×2
ELECTRODE REM PT RTRN 9FT ADLT (ELECTROSURGICAL) ×1 IMPLANT
GAUZE SPONGE 4X4 12PLY STRL (GAUZE/BANDAGES/DRESSINGS) ×2 IMPLANT
GLOVE SRG 8 PF TXTR STRL LF DI (GLOVE) ×1 IMPLANT
GLOVE SURG ENC MOIS LTX SZ8 (GLOVE) ×2 IMPLANT
GLOVE SURG UNDER POLY LF SZ8 (GLOVE) ×1
GOWN STRL REUS W/ TWL LRG LVL3 (GOWN DISPOSABLE) ×1 IMPLANT
GOWN STRL REUS W/ TWL XL LVL3 (GOWN DISPOSABLE) ×1 IMPLANT
GOWN STRL REUS W/TWL LRG LVL3 (GOWN DISPOSABLE) ×1
GOWN STRL REUS W/TWL XL LVL3 (GOWN DISPOSABLE) ×1
KIT BASIN OR (CUSTOM PROCEDURE TRAY) ×2 IMPLANT
KIT TURNOVER KIT B (KITS) ×2 IMPLANT
NEEDLE 22X1 1/2 (OR ONLY) (NEEDLE) ×2 IMPLANT
NS IRRIG 1000ML POUR BTL (IV SOLUTION) ×2 IMPLANT
PACK GENERAL/GYN (CUSTOM PROCEDURE TRAY) ×2 IMPLANT
PAD ARMBOARD 7.5X6 YLW CONV (MISCELLANEOUS) ×4 IMPLANT
PENCIL SMOKE EVACUATOR (MISCELLANEOUS) ×2 IMPLANT
SPECIMEN JAR MEDIUM (MISCELLANEOUS) ×2 IMPLANT
SUT ETHILON 2 0 FS 18 (SUTURE) ×2 IMPLANT
SUT MNCRL AB 4-0 PS2 18 (SUTURE) ×4 IMPLANT
SUT VIC AB 2-0 SH 18 (SUTURE) ×2 IMPLANT
SUT VIC AB 3-0 SH 27 (SUTURE) ×2
SUT VIC AB 3-0 SH 27X BRD (SUTURE) ×2 IMPLANT
SUT VICRYL AB 3 0 TIES (SUTURE) IMPLANT
SYR CONTROL 10ML LL (SYRINGE) ×2 IMPLANT
TOWEL GREEN STERILE (TOWEL DISPOSABLE) ×2 IMPLANT
TOWEL GREEN STERILE FF (TOWEL DISPOSABLE) ×2 IMPLANT

## 2020-12-14 NOTE — Anesthesia Procedure Notes (Signed)
Procedure Name: Intubation Date/Time: 12/14/2020 9:26 AM Performed by: Renato Shin, CRNA Pre-anesthesia Checklist: Patient identified, Emergency Drugs available, Suction available and Patient being monitored Patient Re-evaluated:Patient Re-evaluated prior to induction Oxygen Delivery Method: Circle system utilized Preoxygenation: Pre-oxygenation with 100% oxygen Induction Type: IV induction Ventilation: Mask ventilation without difficulty Laryngoscope Size: Mac and 3 Grade View: Grade I Tube type: Oral Tube size: 7.0 mm Number of attempts: 1 Airway Equipment and Method: Stylet and Oral airway Placement Confirmation: ETT inserted through vocal cords under direct vision, positive ETCO2 and breath sounds checked- equal and bilateral Secured at: 21 cm Tube secured with: Tape Dental Injury: Teeth and Oropharynx as per pre-operative assessment  Comments: Airway managed by paramedic student Abbi Wynetta Emery

## 2020-12-14 NOTE — Discharge Instructions (Signed)
May shower beginning 12/15/2020. May remove dressing on 12/16/2020. May allow warm soapy water to run over incision, then rinse and pat dry. Do not soak in any water (tubs, hot tubs, pools, lakes, oceans) for one week.   No bending at the waist or raising your arms above shoulder level for 2 weeks.    Pain regimen: take over-the-counter tylenol (acetaminophen) 1000mg  every six hours, the prescription ibuprofen (600mg ) every six hours and the robaxin (methocarbamol) 750mg  every six hours. With all three of these, you should be taking something every two hours. Example: tylenol ( acetaminophen) at 8am, ibuprofen at 10am, robaxin (methocarbamol) at 12pm, tylenol (acetaminophen) again at 2pm, ibuprofen again at 4pm, robaxin (methocarbamol) at 6pm. You also have a prescription for oxycodone, which should be taken if the tylenol (acetaminophen), ibuprofen, and robaxin (methocarbamol) are not enough to control your pain. You may take the oxycodone as frequently as every four hours as needed, but if you are taking the other medications as above, you should not need the oxycodone this frequently. You have also been given a prescription for colace (docusate) which is a stool softener. Please take this as prescribed because the oxycodone can cause constipation and the colace (docusate) will minimize or prevent constipation.  Call the office at [phone] for temperature greater than 101.46F, worsening pain, redness or warmth at the incision site.  Please call [phone] to make an appointment for 1 week after surgery for wound check.

## 2020-12-14 NOTE — H&P (Signed)
     Brooke Gonzalez is an 55 y.o. female.   HPI: 60F with soft tissue mass of back. Plan for excision. The patient has had no hospitalizations, doctors visits, ER visits, surgeries, or newly diagnosed allergies since being seen in the office. She did go to the dentist for a regularly scheduled cleaning.    Past Medical History:  Diagnosis Date   History of chronic bronchitis    Hypertension     Past Surgical History:  Procedure Laterality Date   ABDOMINAL HYSTERECTOMY  2014   TONSILLECTOMY     TUBAL LIGATION      History reviewed. No pertinent family history.  Social History:  reports that she has never smoked. She has never been exposed to tobacco smoke. She has never used smokeless tobacco. She reports that she does not drink alcohol and does not use drugs.  Allergies:  Allergies  Allergen Reactions   Morphine Shortness Of Breath and Nausea And Vomiting   Iron Other (See Comments)    "my body doesn't store it"   Latex Other (See Comments)    Skin burning/irritation   Telmisartan Hives    Medications: I have reviewed the patient's current medications.  No results found for this or any previous visit (from the past 48 hour(s)).  No results found.  ROS 10 point review of systems is negative except as listed above in HPI.   Physical Exam Blood pressure (!) 170/97, pulse 73, temperature 98 F (36.7 C), temperature source Oral, resp. rate 17, height 5\' 7"  (1.702 m), weight 88.9 kg, SpO2 100 %. Constitutional: well-developed, well-nourished HEENT: pupils equal, round, reactive to light, 68mm b/l, moist conjunctiva, external inspection of ears and nose normal, hearing intact Oropharynx: normal oropharyngeal mucosa, normal dentition Neck: no thyromegaly, trachea midline, no midline cervical tenderness to palpation Chest: breath sounds equal bilaterally, normal respiratory effort, no midline or lateral chest wall tenderness to palpation/deformity Abdomen: soft, NT, no  bruising, no hepatosplenomegaly GU: normal female genitalia  Back: no wounds, no thoracic/lumbar spine tenderness to palpation, no thoracic/lumbar spine stepoffs, soft tissue mass of midline mid-back Rectal: deferred Extremities: 2+ radial and pedal pulses bilaterally, intact motor and sensation bilateral UE and LE, no peripheral edema MSK: normal gait/station, no clubbing/cyanosis of fingers/toes, normal ROM of all four extremities Skin: warm, dry, no rashes Psych: normal memory, normal mood/affect     Assessment/Plan: 60F with soft tissue mass of back. Plan for excision. Informed consent was obtained after detailed explanation of risks, including bleeding, infection, hematoma/seroma, temporary or permanent neuropathy, recurrence. All questions answered to the patient's satisfaction.   3m, MD General and Trauma Surgery [facility]

## 2020-12-14 NOTE — Op Note (Addendum)
   Operative Note   Date: 12/14/2020  Procedure: excision of soft tissue mass of back  Pre-op diagnosis: soft tissue mass of back Post-op diagnosis: soft tissue mass of back, 7.5x6x3cm  Indication and clinical history: The patient is a 55 y.o. year old female with a soft tissue mass of the back.   Surgeon: Diamantina Monks, MD  Anesthesiologist: Chilton Si, MD Anesthesia: General  Findings:  Specimen: soft tissue mass EBL: <5cc Drains/Implants: none  Disposition: PACU - hemodynamically stable.  Description of procedure: The patient was positioned supine on the operating room table. General anesthetic induction and intubation were uneventful. The patient was repositioned to prone. Time-out was performed verifying correct patient, procedure, signature of informed consent, and administration of pre-operative antibiotics. The back was prepped and draped in the usual sterile fashion.  An incision was made overlying the mass and deepened until the mass was reached. The mass was circumferentially excised. Meticulous hemostasis was confirmed. The cavity was irrigated. The dead space was obliterated with 2-0 vicryl suture. Additional 2-0 vicryl was used for an interrupted deep dermal layer. The skin was closed with 4-0 monocryl. Interrupted 2-0 nylon sutures were used as an additional buttress.    Sterile dressings were applied. All sponge and instrument counts were correct at the conclusion of the procedure. The patient was awakened from anesthesia, extubated uneventfully, and transported to the PACU in good condition. There were no complications.    Diamantina Monks, MD General and Trauma Surgery [facility]

## 2020-12-14 NOTE — Transfer of Care (Signed)
Immediate Anesthesia Transfer of Care Note  Patient: Brooke Gonzalez  Procedure(s) Performed: EXCISION OF LIPOMA LOWER BACK (Bilateral)  Patient Location: PACU  Anesthesia Type:General  Level of Consciousness: drowsy and patient cooperative  Airway & Oxygen Therapy: Patient Spontanous Breathing and Patient connected to face mask oxygen  Post-op Assessment: Report given to RN and Post -op Vital signs reviewed and stable  Post vital signs: Reviewed and stable  Last Vitals:  Vitals Value Taken Time  BP 123/78 12/14/20 1039  Temp    Pulse 67 12/14/20 1042  Resp 19 12/14/20 1042  SpO2 94 % 12/14/20 1042  Vitals shown include unvalidated device data.  Last Pain:  Vitals:   12/14/20 0756  TempSrc:   PainSc: 0-No pain         Complications: No notable events documented.

## 2020-12-14 NOTE — Anesthesia Preprocedure Evaluation (Addendum)
Anesthesia Evaluation  Patient identified by MRN, date of birth, ID band Patient awake    Reviewed: Allergy & Precautions, NPO status , Patient's Chart, lab work & pertinent test results  Airway Mallampati: II  TM Distance: >3 FB     Dental   Pulmonary neg pulmonary ROS,    breath sounds clear to auscultation       Cardiovascular hypertension,  Rhythm:Regular Rate:Normal     Neuro/Psych negative neurological ROS     GI/Hepatic negative GI ROS, Neg liver ROS,   Endo/Other  negative endocrine ROS  Renal/GU negative Renal ROS     Musculoskeletal   Abdominal   Peds  Hematology   Anesthesia Other Findings   Reproductive/Obstetrics                             Anesthesia Physical Anesthesia Plan  ASA: 2  Anesthesia Plan: General   Post-op Pain Management:    Induction: Intravenous  PONV Risk Score and Plan: 3 and Ondansetron, Dexamethasone and Midazolam  Airway Management Planned: Oral ETT  Additional Equipment:   Intra-op Plan:   Post-operative Plan: Extubation in OR  Informed Consent: I have reviewed the patients History and Physical, chart, labs and discussed the procedure including the risks, benefits and alternatives for the proposed anesthesia with the patient or authorized representative who has indicated his/her understanding and acceptance.     Dental advisory given  Plan Discussed with: CRNA and Anesthesiologist  Anesthesia Plan Comments:         Anesthesia Quick Evaluation

## 2020-12-14 NOTE — Anesthesia Postprocedure Evaluation (Signed)
Anesthesia Post Note  Patient: Brooke Gonzalez  Procedure(s) Performed: EXCISION OF LIPOMA LOWER BACK (Bilateral)     Patient location during evaluation: PACU Anesthesia Type: General Level of consciousness: awake Pain management: pain level controlled Vital Signs Assessment: post-procedure vital signs reviewed and stable Respiratory status: spontaneous breathing Cardiovascular status: stable Postop Assessment: no apparent nausea or vomiting Anesthetic complications: no   No notable events documented.  Last Vitals:  Vitals:   12/14/20 1124 12/14/20 1139  BP: 118/71 117/81  Pulse: (!) 52 (!) 59  Resp: 15 14  Temp:    SpO2: 94% 100%    Last Pain:  Vitals:   12/14/20 1139  TempSrc:   PainSc: Asleep                 Jeison Delpilar

## 2020-12-15 ENCOUNTER — Encounter (HOSPITAL_COMMUNITY): Payer: Self-pay | Admitting: Surgery

## 2020-12-17 LAB — SURGICAL PATHOLOGY

## 2021-02-17 IMAGING — CR DG CHEST 2V
2 series · 2 of 2 positions shown · non-contrast
Comparison: None.

CLINICAL DATA: Chest pain

EXAM:
CHEST - 2 VIEW

[w chest pa]
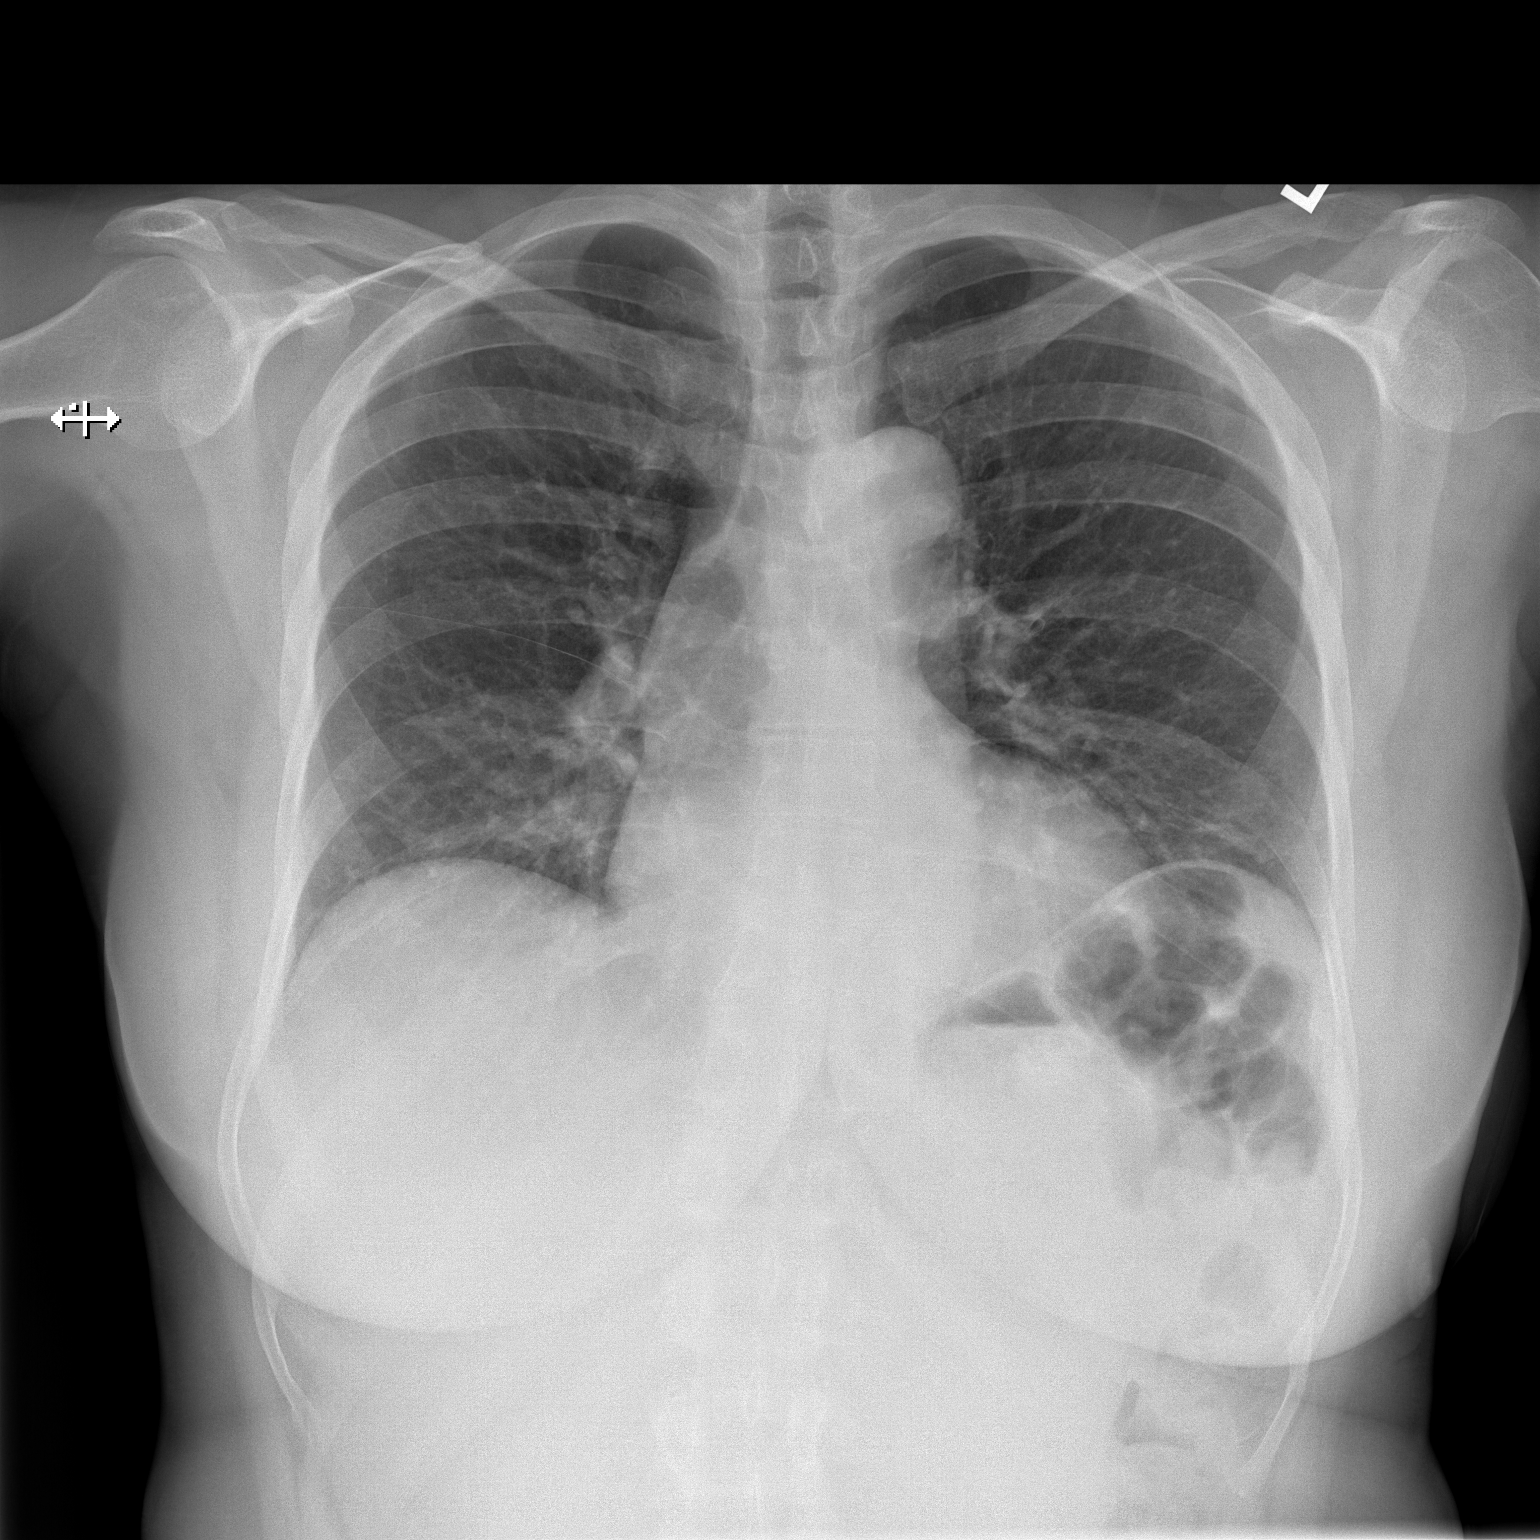

[w chest lat]
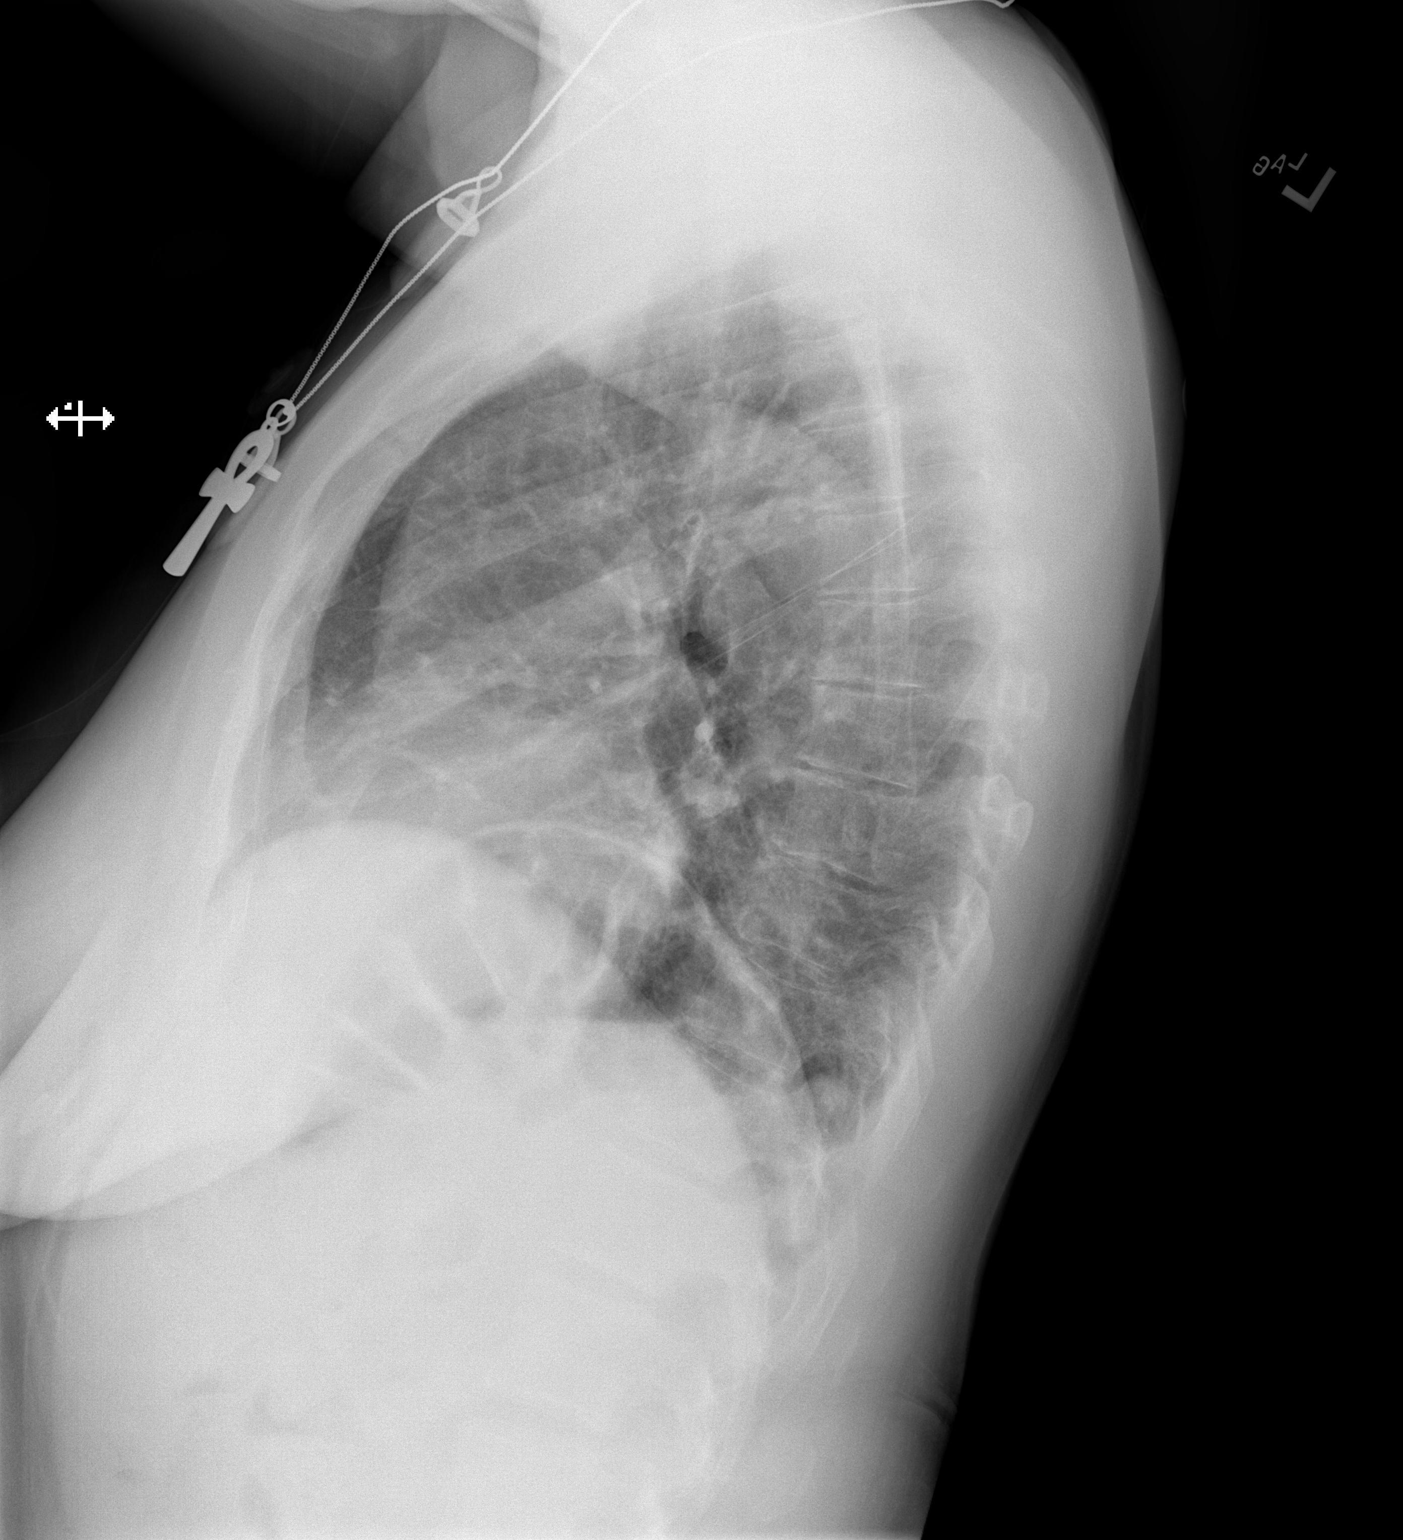

[2 of 2 positions shown; findings below may reference images not displayed]

FINDINGS: The heart size and mediastinal contours are within normal limits.
Both lungs are clear. The visualized skeletal structures are
unremarkable.
IMPRESSION: No active cardiopulmonary disease.

## 2021-08-15 ENCOUNTER — Encounter: Payer: Self-pay | Admitting: Rehabilitative and Restorative Service Providers"

## 2021-08-15 ENCOUNTER — Other Ambulatory Visit: Payer: Self-pay

## 2021-08-15 ENCOUNTER — Ambulatory Visit: Payer: BC Managed Care – PPO | Admitting: Rehabilitative and Restorative Service Providers"

## 2021-08-15 DIAGNOSIS — M6281 Muscle weakness (generalized): Secondary | ICD-10-CM | POA: Diagnosis not present

## 2021-08-15 DIAGNOSIS — M25532 Pain in left wrist: Secondary | ICD-10-CM

## 2021-08-15 NOTE — Therapy (Signed)
OUTPATIENT OCCUPATIONAL THERAPY ORTHO EVALUATION  Patient Name: Brooke Gonzalez MRN: [id number] DOB:06-30-1965, 56 y.o., female Today's Date: 08/15/2021  PCP: Lujean Amel, MD REFERRING PROVIDER: Eugenie Norrie, MD   OT End of Session - 08/15/21 1404     Visit Number 1    Number of Visits 12    Date for OT Re-Evaluation 09/27/21    Authorization Type [employer] State    OT Start Time 1402    OT Stop Time 1500    OT Time Calculation (min) 58 min    Equipment Utilized During Treatment orthotic materials    Activity Tolerance Patient tolerated treatment well;No increased pain;Patient limited by pain;Patient limited by fatigue    Behavior During Therapy [facility] for tasks assessed/performed             Past Medical History:  Diagnosis Date   History of chronic bronchitis    Hypertension    Past Surgical History:  Procedure Laterality Date   ABDOMINAL HYSTERECTOMY  2014   LIPOMA EXCISION Bilateral 12/14/2020   Procedure: EXCISION OF LIPOMA LOWER BACK;  Surgeon: Jesusita Oka, MD;  Location: [place];  Service: General;  Laterality: Bilateral;   TONSILLECTOMY     TUBAL LIGATION     There are no problems to display for this patient.   ONSET DATE: May 30, 2021 (~2 months)   REFERRING DIAG: S69.82XA (ICD-10-CM) - Other specified injuries of left wrist, hand and finger(s), initial encounter  THERAPY DIAG:  Pain in left wrist  Muscle weakness (generalized)  Rationale for Evaluation and Treatment Rehabilitation  SUBJECTIVE:   SUBJECTIVE STATEMENT: She is in [employer] organ[employer] and she states thinks she was closing a door when "checking a building" at work and hit the ulnar side of her wrist, and it's been hurting since. This pain shoots from ulnar wrist to lateral elbow. Every now and then pain shoots into small finger as well. She states "cool" sensation around the wrist area at times as well. She states having injection in wrist that did not give much relief.  She states her  doctor told her that she has a "tear" of TFCC per MRI, but doesn't know exactly where or to what extent. OT called MD's office to try to determine if she has a tear, where, how bad it is, etc., and had to leave a message with the receptionist as staff was not available. She states MD also told her to wear her pre-fab wrist brace, but she states losing it and is not wearing any brace today.    PERTINENT HISTORY: Per MD referral, she suffered a TFCC injury to left wrist. "Stretch and ROM"   PRECAUTIONS: 11 weeks since injury now   [place] Yes She was recommended to not lift more than 1-3# now or anything that was painful to her.   PAIN:  Are you having pain? Yes Rating: 4.5/10 at rest now in left wrist, up to 7/10 in past week when typing and driving   FALLS: Has patient fallen in last 6 months? No   PLOF: Independent  PATIENT GOALS To have the pain to go away.    OBJECTIVE:   HAND DOMINANCE: Right   ADLs: Overall ADLs: States decreased ability to type, grab, hold household objects, pain and inability to open containers, etc.    FUNCTIONAL OUTCOME MEASURES: Eval: Patient Specific Functional Scale: 5.6 (typing, driving, sleeping)  UPPER EXTREMITY ROM    Eval: makes full fist and fully opposes today; AROM not very  painful today, only "pulling" at end ranges.   Active ROM Left eval  Wrist flexion 69  Wrist extension 65  Wrist ulnar deviation 28  Wrist radial deviation 16  Wrist pronation 85  Wrist supination 80  (Blank rows = not tested)    UPPER EXTREMITY MMT:    Eval: not tested due to unknown, possible tears to TFCC; TBD   MMT Left TBD  Elbow flexion   Elbow extension   Wrist flexion   Wrist extension   Wrist ulnar deviation   Wrist radial deviation   Wrist pronation   Wrist supination   (Blank rows = not tested)  HAND FUNCTION: Eval: Grip strength Right: 57 lbs, Left: 53 lbs (she was told to stop is she felt any pain)    COORDINATION: Eval: She states FMS intact (tying shoes, etc.) but GMS are somewhat reduced due to wrist pain Box and Blocks Test: TBD Blocks today (54+ is WFL)  SENSATION: Eval:  Light touch intact today, though she states "cool" feeling about the ulnar wrist crease at times  EDEMA:   Eval: No significant swelling notes on eval    COGNITION: Overall cognitive status: WFL for evaluation today, she is calm and pleasant   OBSERVATIONS:   Eval: She does have some discomfort with pronation, ulnar deviation, and extension of wrist, but nothing severe. If she has a tear, it's likely very mild, and it's been 11 weeks since injury. Some DRUJ tenderness with provacative testing, and is less painful when supported circumferentially. TFCC tender to shear test. Some possible ECU pain/inflammation as well.    TODAY'S TREATMENT:  Eval:  Custom orthotic fabrication was indicated due to pt's healing TFCC injury and some pain with instability and need for safe, functional positioning. OT fabricated custom soft TFCC wrist orthotic to help stabilize DRUJ and not put pressure on ulnar styloid process. It fit well- pt states a comfortable fit. While wearing, she had less pain with resisted wrist flexion (in near full ext of wrist). Pt was educated on the wearing schedule (wear less tightly during rest in day/night, but wear tighter with resistive activities like driving). It will be checked/adjusted in upcoming sessions, as needed. She may even need different orthotic based on the info that was requested from the doctor's office (tear severity, etc.).   She is also given initial stretches as below, advised on use of heat and ice modalities for warmup and pain relief. Pt states understanding and tolerates exercises with no added pain.   Exercises - Seated Wrist Flexion with Overpressure  - 4 x daily - 3-5 reps - 15 sec hold - Wrist Extension Stretch Pronated  - 4 x daily - 3-5 reps - 15 hold   PATIENT  EDUCATION: Education details: See tx section above for details  Person educated: Patient Education method: Verbal Instruction, Teach back, Handouts  Education comprehension: States and demonstrates understanding, Additional Education required    HOME EXERCISE PROGRAM: Access Code: [email or web address] URL: [email or web address] Date: 08/15/2021 Prepared by: Benito Mccreedy  GOALS: Goals reviewed with patient? Yes   SHORT TERM GOALS: (STG required if POC>30 days)  Pt will obtain protective, custom orthotic. Target date: 08/15/21 Goal status: MET  2.  Pt will demo/state understanding of initial HEP to improve pain levels and prerequisite motion. Target date: 08/23/21 Goal status: INITIAL   LONG TERM GOALS:  Pt will improve functional ability by decreased impairment per PSFS assessment from 5.6 to 8 or better, for better quality of life. Target date:  09/27/21 Goal status: INITIAL  2.  Pt will improve grip strength in left hand from slightly painful 53lbs to at least non-painful 60lbs for functional use at home and in IADLs. Target date: 09/27/21 Goal status: INITIAL  3.  Pt will improve A/ROM in left wrist flex and ext from <70* each to at least >70* each, to have functional motion for tasks like reach and grasp.  Target date: 09/27/21 Goal status: INITIAL  4.  Pt will improve strength in left wrist ulnar deviation to at least 4+/5 MMT to have increased functional ability to carry out selfcare and higher-level homecare tasks with no difficulty. Target date: 09/27/21 Goal status: INITIAL  5.  Pt will decrease pain at worst from 7/10 to 2/10 or better to have better sleep and occupational participation in daily roles. Target date: 09/27/21 Goal status: INITIAL    ASSESSMENT:  CLINICAL IMPRESSION: Patient is a 56 y.o. female who was seen today for occupational therapy evaluation for left wrist TFCC injury, pain and decreased functional ability.  (OT contacted MD's office  about MRI results, additional info for tear/severity, etc.) She will benefit from OP OT to increase motion, strength, functional performance.   PERFORMANCE DEFICITS in functional skills including IADLs, coordination, dexterity, sensation, ROM, strength, pain, fascial restrictions, flexibility, GMC, body mechanics, endurance, decreased knowledge of precautions, and UE functional use, cognitive skills including problem solving, and psychosocial skills including coping strategies, habits, and routines and behaviors.   IMPAIRMENTS are limiting patient from IADLs, rest and sleep, work, play, and leisure.   COMORBIDITIES may have co-morbidities  that affects occupational performance. Patient will benefit from skilled OT to address above impairments and improve overall function.  MODIFICATION OR ASSISTANCE TO COMPLETE EVALUATION: No modification of tasks or assist necessary to complete an evaluation.  OT OCCUPATIONAL PROFILE AND HISTORY: Problem focused assessment: Including review of records relating to presenting problem.  CLINICAL DECISION MAKING: Moderate - several treatment options, min-mod task modification necessary  REHAB POTENTIAL: Good  EVALUATION COMPLEXITY: Low     PLAN: OT FREQUENCY: 1-2x/week (up to 12 visits)  OT DURATION: 6 weeks (through 09/27/21)  PLANNED INTERVENTIONS: self care/ADL training, therapeutic exercise, therapeutic activity, manual therapy, passive range of motion, splinting, ultrasound, fluidotherapy, compression bandaging, moist heat, cryotherapy, contrast bath, patient/family education, coping strategies training, and Re-evaluation  RECOMMENDED OTHER SERVICES: none now   CONSULTED AND AGREED WITH PLAN OF CARE: Patient  PLAN FOR NEXT SESSION: Check wrist orthotic and need for possible immobilization or resting orthotic (especially depending on info from MD & MRI), check initial HEP and upgrade stretches and functional activities as tolerated.   Benito Mccreedy, OTR/L, CHT 08/15/2021, 3:59 PM

## 2021-08-20 ENCOUNTER — Encounter: Payer: BC Managed Care – PPO | Admitting: Rehabilitative and Restorative Service Providers"

## 2021-08-28 ENCOUNTER — Ambulatory Visit: Payer: BC Managed Care – PPO | Admitting: Rehabilitative and Restorative Service Providers"

## 2021-08-28 ENCOUNTER — Encounter: Payer: Self-pay | Admitting: Rehabilitative and Restorative Service Providers"

## 2021-08-28 DIAGNOSIS — M25532 Pain in left wrist: Secondary | ICD-10-CM

## 2021-08-28 DIAGNOSIS — M6281 Muscle weakness (generalized): Secondary | ICD-10-CM

## 2021-08-28 NOTE — Therapy (Signed)
OUTPATIENT OCCUPATIONAL THERAPY TREATMENT NOTE   Patient Name: Brooke Gonzalez MRN: [id number] DOB:1965/09/11, 56 y.o., female Today's Date: 08/28/2021  PCP: Lujean Amel, MD REFERRING PROVIDER: Eugenie Norrie, MD  END OF SESSION:   OT End of Session - 08/28/21 1446     Visit Number 2    Number of Visits 12    Date for OT Re-Evaluation 09/27/21    Authorization Type BCBS State    OT Start Time 1440    OT Stop Time 1545    OT Time Calculation (min) 65 min    Equipment Utilized During Treatment orthotic materials    Activity Tolerance Patient tolerated treatment well;No increased pain;Patient limited by pain;Patient limited by fatigue    Behavior During Therapy [facility] for tasks assessed/performed             Past Medical History:  Diagnosis Date   History of chronic bronchitis    Hypertension    Past Surgical History:  Procedure Laterality Date   ABDOMINAL HYSTERECTOMY  2014   LIPOMA EXCISION Bilateral 12/14/2020   Procedure: EXCISION OF LIPOMA LOWER BACK;  Surgeon: Jesusita Oka, MD;  Location: [place];  Service: General;  Laterality: Bilateral;   TONSILLECTOMY     TUBAL LIGATION     There are no problems to display for this patient.   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 "Stretch and ROM"    PRECAUTIONS: ~13 weeks since injury now    [place] Yes She was recommended to not lift more than 1-3# now or anything that was painful to her.  SUBJECTIVE:  She states she had pain in wrist TFCC strap and needed more immobilization. She also states not tolerating AROM exercises well (had some pain).   OT spoke with referring MD's  office to confirm TFCC tear, and orthotics were advisable, so OT will make custom immobilization orthotic today to protect healing wrist ligament.  She states she has an event in October for work that she can't miss and that's why she didn't proceed with surgical repair.   PAIN:  Are you having pain? Yes  Rating: 5/10 at rest now in left wrist   OBJECTIVE: (All objective assessments below are from initial evaluation on: 08/15/21 unless otherwise specified.)   HAND DOMINANCE: Right    ADLs: Overall ADLs: States decreased ability to type, grab, hold household objects, pain and inability to open containers, etc.      FUNCTIONAL OUTCOME MEASURES: Eval: Patient Specific Functional Scale: 5.6 (typing, driving, sleeping)   UPPER EXTREMITY ROM    Eval: makes full fist and fully opposes today; AROM not very painful today, only "pulling" at end ranges.    Active ROM Left eval  Wrist flexion 69  Wrist extension 65  Wrist ulnar deviation 28  Wrist radial deviation 16  Wrist pronation 85  Wrist supination 80  (Blank rows = not tested)       UPPER EXTREMITY MMT:    Eval: not tested due to unknown, possible tears to TFCC; TBD    MMT Left TBD  Elbow flexion    Elbow extension    Wrist flexion    Wrist extension    Wrist  ulnar deviation    Wrist radial deviation    Wrist pronation    Wrist supination    (Blank rows = not tested)   HAND FUNCTION: Eval: Grip strength Right: 57 lbs, Left: 53 lbs (she was told to stop is she felt any pain)    COORDINATION: Eval: She states FMS intact (tying shoes, etc.) but GMS are somewhat reduced due to wrist pain Box and Blocks Test: TBD Blocks today (54+ is [facility])   SENSATION: Eval:  Light touch intact today, though she states "cool" feeling about the ulnar wrist crease at times   EDEMA:             Eval: No significant swelling notes on eval     COGNITION: Overall cognitive status: WFL for evaluation today, she is calm and pleasant     OBSERVATIONS:             Eval: She does have some discomfort with pronation, ulnar deviation, and extension of wrist, but nothing severe. If she has a tear, it's likely very mild, and it's been 11 weeks since injury. Some DRUJ tenderness with provacative testing, and is less painful when supported circumferentially. TFCC tender to shear test. Some possible ECU pain/inflammation as well.      TODAY'S TREATMENT:  08/28/21: Custom orthotic fabrication was indicated due to pt's torn and painful left wrist TFCC ligament complex and need for safe, functional positioning. OT fabricated custom muenster orthotic (forearm in slight supination (aprox 10-20*) and wrist in neutral, fingers free) for pt today to rest healing ligaments and prevent painful supination/pronation and wrist motion. Care was taken for tight molding around wrist and palm. She was given sleev to wear underneath for her comfort and to prevent chafing, etc. Orthotic fit well with no areas of pressure, pt states a comfortable fit. Pt was educated on the wearing schedule (all the time as tolerated), to call or come in ASAP if it is causing any irritation or is not achieving desired function. It will be checked/adjusted in upcoming sessions, as needed. Pt states understanding.   OT also changes HEP to 4-6 x day removing elbow strap to do elbow flex/ext 15x as tolerated, shoulder flex/ext x15 as tolerated and unrestricted hand motion as tolerated. She was recommended against painful use or weightbearing. She states understanding.     PATIENT EDUCATION: Education details: See tx section above for details  Person educated: Patient Education method: Verbal Instruction, Teach back, Handouts  Education comprehension: States and demonstrates understanding, Additional Education required      HOME EXERCISE PROGRAM: Access Code: [email or web address] URL: [email or web address] Date: 08/15/2021 Prepared by: Benito Mccreedy   GOALS: Goals  reviewed with patient? Yes     SHORT TERM GOALS: (STG required if POC>30 days)   Pt will obtain protective, custom orthotic. Target date: 08/15/21 Goal status: MET   2.  Pt will demo/state understanding of initial HEP to improve pain levels and prerequisite motion. Target date: 08/23/21 Goal status: INITIAL     LONG TERM GOALS:   Pt will improve functional ability by decreased impairment per PSFS assessment from 5.6 to 8 or better, for better quality of life. Target date: 09/27/21 Goal status: INITIAL   2.  Pt will improve grip strength in left hand from slightly painful 53lbs to at least non-painful 60lbs for functional use at home and in IADLs. Target date: 09/27/21 Goal status: INITIAL   3.  Pt will improve A/ROM in left wrist flex and ext from <70* each  to at least >70* each, to have functional motion for tasks like reach and grasp.  Target date: 09/27/21 Goal status: INITIAL   4.  Pt will improve strength in left wrist ulnar deviation to at least 4+/5 MMT to have increased functional ability to carry out selfcare and higher-level homecare tasks with no difficulty. Target date: 09/27/21 Goal status: INITIAL   5.  Pt will decrease pain at worst from 7/10 to 2/10 or better to have better sleep and occupational participation in daily roles. Target date: 09/27/21 Goal status: INITIAL       ASSESSMENT:   CLINICAL IMPRESSION: 08/28/21: OT spoke with referring MD's office to confirm TFCC tear, and orthotics were advisable, so OT made custom immobilization muenster orthotic today to protect healing wrist ligament. Since it's been ~2 months since injury already, but she was not immobilized, healing times are uncertain. We will attempt light AROM to wrist in 2-3 weeks, and check stability. Leo Grosser will be trimmed down to ulnar gutter once forearm motion more tolerable. It's possible that healing could take 6 more weeks. OT will monitor.   Eval: Patient is a 56 y.o. female who was seen  today for occupational therapy evaluation for left wrist TFCC injury, pain and decreased functional ability.  (OT contacted MD's office about MRI results, additional info for tear/severity, etc.) She will benefit from OP OT to increase motion, strength, functional performance.      PLAN: OT FREQUENCY: 1-2x/week (up to 12 visits)   OT DURATION: 6 weeks (through 09/27/21)   PLANNED INTERVENTIONS: self care/ADL training, therapeutic exercise, therapeutic activity, manual therapy, passive range of motion, splinting, ultrasound, fluidotherapy, compression bandaging, moist heat, cryotherapy, contrast bath, patient/family education, coping strategies training, and Re-evaluation   RECOMMENDED OTHER SERVICES: none now    CONSULTED AND AGREED WITH PLAN OF CARE: Patient   PLAN FOR NEXT SESSION:  Check orthotic and HEP, recommendations. Consider taking 1-2 week pause in therapy to allow for TFCC healing.   Benito Mccreedy, OTR/L, CHT 08/28/2021, 4:33 PM

## 2021-09-06 ENCOUNTER — Encounter: Payer: BC Managed Care – PPO | Admitting: Rehabilitative and Restorative Service Providers"

## 2021-09-11 ENCOUNTER — Encounter: Payer: Self-pay | Admitting: Rehabilitative and Restorative Service Providers"

## 2021-09-11 ENCOUNTER — Encounter: Payer: BC Managed Care – PPO | Admitting: Rehabilitative and Restorative Service Providers"

## 2021-09-11 ENCOUNTER — Ambulatory Visit: Payer: BC Managed Care – PPO | Admitting: Rehabilitative and Restorative Service Providers"

## 2021-09-11 DIAGNOSIS — M6281 Muscle weakness (generalized): Secondary | ICD-10-CM

## 2021-09-11 DIAGNOSIS — M25532 Pain in left wrist: Secondary | ICD-10-CM

## 2021-09-11 NOTE — Therapy (Signed)
OUTPATIENT OCCUPATIONAL THERAPY TREATMENT NOTE   Patient Name: Brooke Gonzalez MRN: [id number] DOB:October 16, 1965, 56 y.o., female Today's Date: 09/11/2021  PCP: Lujean Amel, MD REFERRING PROVIDER: Eugenie Norrie, MD  END OF SESSION:   OT End of Session - 09/11/21 1349     Visit Number 3    Number of Visits 12    Date for OT Re-Evaluation 09/27/21    Authorization Type BCBS State    OT Start Time 2641    OT Stop Time 1440    OT Time Calculation (min) 51 min    Equipment Utilized During Treatment orthotic materials    Activity Tolerance Patient tolerated treatment well;No increased pain;Patient limited by pain;Patient limited by fatigue    Behavior During Therapy [facility] for tasks assessed/performed              Past Medical History:  Diagnosis Date   History of chronic bronchitis    Hypertension    Past Surgical History:  Procedure Laterality Date   ABDOMINAL HYSTERECTOMY  2014   LIPOMA EXCISION Bilateral 12/14/2020   Procedure: EXCISION OF LIPOMA LOWER BACK;  Surgeon: Jesusita Oka, MD;  Location: [place];  Service: General;  Laterality: Bilateral;   TONSILLECTOMY     TUBAL LIGATION     There are no problems to display for this patient.   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 "Stretch and ROM"    PRECAUTIONS: 14 weeks since injury now    [place] Yes She was recommended to not lift more than 1-3# now or anything that was painful to her.  SUBJECTIVE:  She states she has not been able to wear muenster orthotic while at work for some reason, so she wears her pre-fab black wrist cock-up, but it allows for forearm motion and  she types a lot in pronation and has been having pain and slightly worse pain in past 4-6 days.    PAIN:  Are you having pain? Yes  Rating: 6/10 at rest now in left wrist   OBJECTIVE: (All objective assessments below are from initial evaluation on: 08/15/21 unless otherwise specified.)   HAND DOMINANCE: Right    ADLs: Overall ADLs: States decreased ability to type, grab, hold household objects, pain and inability to open containers, etc.      FUNCTIONAL OUTCOME MEASURES: Eval: Patient Specific Functional Scale: 5.6 (typing, driving, sleeping)   UPPER EXTREMITY ROM    Eval: makes full fist and fully opposes today; AROM not very painful today, only "pulling" at end ranges.    Active ROM Left eval Left  09/11/21  Wrist flexion 69 33  Wrist extension 65 55  Wrist ulnar deviation 28   Wrist radial deviation 16   Wrist pronation 85   Wrist supination 80   (Blank rows = not tested)       UPPER EXTREMITY MMT:    Eval: not tested due to unknown, possible tears to TFCC; TBD    MMT Left TBD  Elbow flexion    Elbow extension    Wrist flexion    Wrist extension    Wrist ulnar deviation    Wrist radial deviation    Wrist pronation  Wrist supination    (Blank rows = not tested)   HAND FUNCTION: Eval: Grip strength Right: 57 lbs, Left: 53 lbs (she was told to stop is she felt any pain)    COORDINATION: Eval: She states FMS intact (tying shoes, etc.) but GMS are somewhat reduced due to wrist pain Box and Blocks Test: TBD Blocks today (54+ is [facility])   SENSATION: Eval:  Light touch intact today, though she states "cool" feeling about the ulnar wrist crease at times   EDEMA:             Eval: No significant swelling notes on eval     COGNITION: Overall cognitive status: WFL for evaluation today, she is calm and pleasant    OBSERVATIONS:             Eval: She does have some discomfort with pronation, ulnar deviation, and extension of wrist, but nothing severe. If she has a  tear, it's likely very mild, and it's been 11 weeks since injury. Some DRUJ tenderness with provacative testing, and is less painful when supported circumferentially. TFCC tender to shear test. Some possible ECU pain/inflammation as well.      TODAY'S TREATMENT:  09/11/21: She is not wearing muenster orthotic when she arrives and it looks a bit tight after OT asks her to put it on. So OT adjusts it in several places and it's fitting much better again.  OT emphasizes that she must toy to wear it at all times and even at work if she can, and explains her increased pain is likely from moving forearm and not avoiding provacative positions/resting enough (self-care, management).  She does ROM and she is significantly tighter since getting immobilization ~ 2 weeks ago, so OT re-institutes an HEP of gentle wrist motion now, that she states "pulls" but doesn't hurt or cause sharp pain. She performs in session as well as elbow motion and making fist within orthotic. That should help with stiffness and possibly pain too, but FA motion is again avoided. She is edu for modalities for pain control and mainly resting and avoiding pain, but moving is ok in wrist flex, ext 2-4 x day as long as no sharp pain. She states understanding and leaves with muenster orthotic on, fitting well, no complaints or increased pain after AROM.  OT also does 5 mins [place] (0.6 w/cm^2, 3MHz, 100%) at end of session to try to boost healing of TFCC. She states no increase or decrease in pain at end.    Exercises - Bend and Straighten Elbow  - 3-4 x daily - 1-2 sets - 10-15 reps - Tendon Glides  - 3-4 x daily - 3-5 reps - 2-3 seconds hold - Bend and Pull Back Wrist SLOWLY  - 3-4 x daily - 1-2 sets - 10-15 reps    PATIENT EDUCATION: Education details: See tx section above for details  Person educated: Patient Education method: Verbal Instruction, Teach back, Handouts  Education comprehension: States and demonstrates understanding, Additional  Education required      HOME EXERCISE PROGRAM: Access Code: [email or web address] URL: [email or web address]    GOALS: Goals reviewed with patient? Yes     SHORT TERM GOALS: (STG required if POC>30 days)   Pt will obtain protective, custom orthotic. Target date: 08/15/21 Goal status: MET   2.  Pt will demo/state understanding of initial HEP to improve pain levels and prerequisite motion. Target date: 08/23/21 Goal status: 09/11/21: MET but needs adjusted in upcoming weeks to match healing  LONG TERM GOALS:   Pt will improve functional ability by decreased impairment per PSFS assessment from 5.6 to 8 or better, for better quality of life. Target date: 09/27/21 Goal status: INITIAL   2.  Pt will improve grip strength in left hand from slightly painful 53lbs to at least non-painful 60lbs for functional use at home and in IADLs. Target date: 09/27/21 Goal status: INITIAL   3.  Pt will improve A/ROM in left wrist flex and ext from <70* each to at least >70* each, to have functional motion for tasks like reach and grasp.  Target date: 09/27/21 Goal status: INITIAL   4.  Pt will improve strength in left wrist ulnar deviation to at least 4+/5 MMT to have increased functional ability to carry out selfcare and higher-level homecare tasks with no difficulty. Target date: 09/27/21 Goal status: INITIAL   5.  Pt will decrease pain at worst from 7/10 to 2/10 or better to have better sleep and occupational participation in daily roles. Target date: 09/27/21 Goal status: INITIAL       ASSESSMENT:   CLINICAL IMPRESSION: 09/11/21: She doesn't seem to have been wearing new orthotic thoroughly, each day yet. And her reports about "not able to wear a brace at work," and pain with pronated typing, etc., are concerning. She was cautioned that she needs to avoid pain as much as possible now or surgery may become unavoidable. F/u next week to again check motion and pain, continue ultrasound for TFCC  healing.    08/28/21: OT spoke with referring MD's office to confirm TFCC tear, and orthotics were advisable, so OT made custom immobilization muenster orthotic today to protect healing wrist ligament. Since it's been ~2 months since injury already, but she was not immobilized, healing times are uncertain. We will attempt light AROM to wrist in 2-3 weeks, and check stability. Leo Grosser will be trimmed down to ulnar gutter once forearm motion more tolerable. It's possible that healing could take 6 more weeks. OT will monitor.       PLAN: OT FREQUENCY: 1-2x/week (up to 12 visits)   OT DURATION: 6 weeks (through 09/27/21)   PLANNED INTERVENTIONS: self care/ADL training, therapeutic exercise, therapeutic activity, manual therapy, passive range of motion, splinting, ultrasound, fluidotherapy, compression bandaging, moist heat, cryotherapy, contrast bath, patient/family education, coping strategies training, and Re-evaluation   RECOMMENDED OTHER SERVICES: none now    CONSULTED AND AGREED WITH PLAN OF CARE: Patient   PLAN FOR NEXT SESSION:  F/u next week to again check motion and pain, continue ultrasound for TFCC healing.    Benito Mccreedy, OTR/L, CHT 09/11/2021, 5:52 PM

## 2021-09-17 ENCOUNTER — Ambulatory Visit: Payer: BC Managed Care – PPO | Admitting: Rehabilitative and Restorative Service Providers"

## 2021-09-17 ENCOUNTER — Encounter: Payer: Self-pay | Admitting: Rehabilitative and Restorative Service Providers"

## 2021-09-17 DIAGNOSIS — M6281 Muscle weakness (generalized): Secondary | ICD-10-CM | POA: Diagnosis not present

## 2021-09-17 DIAGNOSIS — M25532 Pain in left wrist: Secondary | ICD-10-CM

## 2021-09-17 NOTE — Therapy (Addendum)
OUTPATIENT OCCUPATIONAL THERAPY TREATMENT & DISCHARGE NOTE   Patient Name: Brooke Gonzalez MRN: [id number] DOB:Nov 24, 1965, 56 y.o., female Today's Date: 09/17/2021  PCP: Lujean Amel, MD REFERRING PROVIDER: Eugenie Norrie, MD  END OF SESSION:   OT End of Session - 09/17/21 1101     Visit Number 4    Number of Visits 12    Date for OT Re-Evaluation 09/27/21    Authorization Type [employer] State    OT Start Time 1102    OT Stop Time 1148    OT Time Calculation (min) 46 min    Equipment Utilized During Treatment orthotic materials    Activity Tolerance Patient tolerated treatment well;No increased pain;Patient limited by pain;Patient limited by fatigue    Behavior During Therapy [facility] for tasks assessed/performed             Past Medical History:  Diagnosis Date   History of chronic bronchitis    Hypertension    Past Surgical History:  Procedure Laterality Date   ABDOMINAL HYSTERECTOMY  2014   LIPOMA EXCISION Bilateral 12/14/2020   Procedure: EXCISION OF LIPOMA LOWER BACK;  Surgeon: Jesusita Oka, MD;  Location: [facility];  Service: General;  Laterality: Bilateral;   TONSILLECTOMY     TUBAL LIGATION     There are no problems to display for this patient.   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 "Stretch and ROM"    PRECAUTIONS: 09/17/21: ~16 weeks since injury now; only 3 weeks since asked to immobilize in long arm orthotic.    WEIGHT BEARING RESTRICTIONS Yes She was recommended to not lift more than 1-3# now or anything that was painful to her.   SUBJECTIVE:  She continues to state non-compliance with long-arm orthotic and that her pre-fab is not  comfortable/not supportive. She states "bad pain in past few days" (though less at present) and feeling "throbbing" and other sensations.  OT will make FA based wrist immobilizer today.    PAIN:  Are you having pain? Yes  Rating: 4/10 at rest now in left wrist   OBJECTIVE: (All objective assessments below are from initial evaluation on: 08/15/21 unless otherwise specified.)   HAND DOMINANCE: Right    ADLs: Overall ADLs: States decreased ability to type, grab, hold household objects, pain and inability to open containers, etc.      FUNCTIONAL OUTCOME MEASURES: Eval: Patient Specific Functional Scale: 5.6 (typing, driving, sleeping)   UPPER EXTREMITY ROM    Eval: makes full fist and fully opposes today; AROM not very painful today, only "pulling" at end ranges.    Active ROM Left eval Left  09/11/21 Left 09/17/21  Wrist flexion 69 33 53  Wrist extension 65 55 35  Wrist ulnar deviation 28    Wrist radial deviation 16    Wrist pronation 85    Wrist supination 80    (Blank rows = not tested)       UPPER EXTREMITY MMT:    Eval: not tested due to unknown, possible tears to TFCC; TBD    MMT Left TBD  Elbow flexion    Elbow extension    Wrist flexion    Wrist extension    Wrist ulnar  deviation    Wrist radial deviation    Wrist pronation    Wrist supination    (Blank rows = not tested)   HAND FUNCTION: Eval: Grip strength Right: 57 lbs, Left: 53 lbs (she was told to stop is she felt any pain)    COORDINATION: Eval: She states FMS intact (tying shoes, etc.) but GMS are somewhat reduced due to wrist pain Box and Blocks Test: TBD Blocks today (54+ is [facility])   SENSATION: Eval:  Light touch intact today, though she states "cool" feeling about the ulnar wrist crease at times     OBSERVATIONS:             Eval: She does have some discomfort with pronation, ulnar deviation, and extension of wrist, but nothing severe. If she has a tear, it's likely very mild, and it's been 11  weeks since injury. Some DRUJ tenderness with provacative testing, and is less painful when supported circumferentially. TFCC tender to shear test. Some possible ECU pain/inflammation as well.      TODAY'S TREATMENT:  09/17/21: Due to not wearing long-arm brace at work or night, she is not getting enough rest and pre-fab wrist cock-up is not immobilizing her well or comfortable.   OT fabricates FA based wrist immobilizing orthotic today (fingers, thumb free) which she will hopefully tolerate better and be immobilized at all times, as recommended. She states understanding the importance of this. Next she performs AROM for new measures and wrist flexion and ext are not significantly painful but the measurements seem to "see-saw" today. Light PROM from therapist is not tolerated very well, so that will still be withheld.  Concurrent with self-care, pain management education, OT also uses MH (pre-AROM) and ice modalities (post session) to help her have relief and she sates enjoying both and having some relief with both. OT also uses K-tape over TFCC and ulnar wrist to help support her healing structures and put wrist in better alignment, encourage supination vs pronation, and she again states more support and less pain with AROM.      OT explains that she should mainly be resting/immobilizing now (for ~3 more weeks) to allow healing, periodic light AROM just to prevent stiffness, avoid extreme pro and supination, then we will work towards more aggressive ROM and strength as tolerated in ~3 more weeks. It would be "normal" to have pain with and try to avoid weighted torsional loads, distraction, or compression of the wrist and repetitive resistance to the wrist and forearm for  4-6 months.  She states understanding this, but has been fairly non-compliant with immobilization so far.     PATIENT EDUCATION: Education details: See tx section above for details  Person educated: Patient Education method: Verbal  Instruction, Teach back, Handouts  Education comprehension: States and demonstrates understanding, Additional Education required      HOME EXERCISE PROGRAM: Access Code: [email or web address] URL: [email or web address]    GOALS: Goals reviewed with patient? Yes     SHORT TERM GOALS: (STG required if POC>30 days)   Pt will obtain protective, custom orthotic. Target date: 08/15/21 Goal status: MET   2.  Pt will demo/state understanding of initial HEP to improve pain levels and prerequisite motion. Target date: 08/23/21 Goal status: 09/11/21: MET but needs adjusted in upcoming weeks to match healing     LONG TERM GOALS:   Pt will improve functional ability by decreased impairment per PSFS assessment from 5.6 to 8 or better, for better quality of life. Target date: 09/27/21  Goal status: INITIAL   2.  Pt will improve grip strength in left hand from slightly painful 53lbs to at least non-painful 60lbs for functional use at home and in IADLs. Target date: 09/27/21 Goal status: INITIAL   3.  Pt will improve A/ROM in left wrist flex and ext from <70* each to at least >70* each, to have functional motion for tasks like reach and grasp.  Target date: 09/27/21 Goal status: INITIAL   4.  Pt will improve strength in left wrist ulnar deviation to at least 4+/5 MMT to have increased functional ability to carry out selfcare and higher-level homecare tasks with no difficulty. Target date: 09/27/21 Goal status: INITIAL   5.  Pt will decrease pain at worst from 7/10 to 2/10 or better to have better sleep and occupational participation in daily roles. Target date: 09/27/21 Goal status: INITIAL       ASSESSMENT:   CLINICAL IMPRESSION: 09/17/21: She has been compliant with wearing muenster orthotic/immobilization for the past ~3 weeks (not wearing at night or work, [employer] research [employer], government.). She has also stated increased pains at times. She is cautioned that this could prevent or prolong healing. If not feeling  better by next week, OT discussed sending her back to her doctor in 1-2 weeks for another surgery consult. She agrees that if she doesn't have some pain relief in next 1-2 weeks, she wants to pursue surgery sooner (which is what her doctor originally intended, but she chose not to).       PLAN: OT FREQUENCY: 1-2x/week (up to 12 visits)   OT DURATION: 6 weeks (through 09/27/21)   PLANNED INTERVENTIONS: self care/ADL training, therapeutic exercise, therapeutic activity, manual therapy, passive range of motion, splinting, ultrasound, fluidotherapy, compression bandaging, moist heat, cryotherapy, contrast bath, patient/family education, coping strategies training, and Re-evaluation   RECOMMENDED OTHER SERVICES: none now    CONSULTED AND AGREED WITH PLAN OF CARE: Patient   PLAN FOR NEXT SESSION:  F/u next week to again check motion and pain, and new orthotic. If no significant report of decreased pain, she will be sent back to her doctor for possible surgery consult.    Benito Mccreedy, OTR/L, CHT 09/17/2021, 12:07 PM      OCCUPATIONAL THERAPY DISCHARGE SUMMARY  Visits from Start of Care: 4  She did not follow up with OT as discussed, but did go back to doc for sx consult and ended up getting TFCC repair.  She sis not contact our office at all, but is now doing post-op therapy elsewhere.  She will be d/c's here officially at this point.  Goals could not be reviewed, see notes for details.   Benito Mccreedy, OTR/L, CHT 11/06/21

## 2021-09-26 ENCOUNTER — Encounter: Payer: BC Managed Care – PPO | Admitting: Rehabilitative and Restorative Service Providers"
